# Patient Record
Sex: Male | Born: 1950 | Race: White | Hispanic: No | Smoking: Never smoker
Health system: Southern US, Community
[De-identification: ages and names within clinical notes are randomized; demographics above are authoritative.]

## PROBLEM LIST (undated history)

## (undated) DIAGNOSIS — K219 Gastro-esophageal reflux disease without esophagitis: Secondary | ICD-10-CM

## (undated) DIAGNOSIS — R519 Headache, unspecified: Secondary | ICD-10-CM

## (undated) DIAGNOSIS — M5430 Sciatica, unspecified side: Secondary | ICD-10-CM

## (undated) DIAGNOSIS — R51 Headache: Secondary | ICD-10-CM

## (undated) DIAGNOSIS — F329 Major depressive disorder, single episode, unspecified: Secondary | ICD-10-CM

## (undated) DIAGNOSIS — I10 Essential (primary) hypertension: Secondary | ICD-10-CM

## (undated) DIAGNOSIS — M549 Dorsalgia, unspecified: Secondary | ICD-10-CM

## (undated) DIAGNOSIS — I2510 Atherosclerotic heart disease of native coronary artery without angina pectoris: Secondary | ICD-10-CM

## (undated) DIAGNOSIS — IMO0001 Reserved for inherently not codable concepts without codable children: Secondary | ICD-10-CM

## (undated) DIAGNOSIS — F32A Depression, unspecified: Secondary | ICD-10-CM

## (undated) DIAGNOSIS — M1990 Unspecified osteoarthritis, unspecified site: Secondary | ICD-10-CM

## (undated) DIAGNOSIS — J449 Chronic obstructive pulmonary disease, unspecified: Secondary | ICD-10-CM

## (undated) HISTORY — PX: KNEE ARTHROSCOPY: SUR90

## (undated) HISTORY — PX: CYST EXCISION: SHX5701

## (undated) HISTORY — PX: TONSILLECTOMY: SUR1361

---

## 2011-01-02 ENCOUNTER — Observation Stay (HOSPITAL_COMMUNITY)
Admission: EM | Admit: 2011-01-02 | Discharge: 2011-01-02 | Payer: Self-pay | Source: Home / Self Care | Admitting: Emergency Medicine

## 2011-01-07 LAB — DIFFERENTIAL
Eosinophils Absolute: 0.1 10*3/uL (ref 0.0–0.7)
Eosinophils Relative: 3 % (ref 0–5)
Lymphs Abs: 1.1 10*3/uL (ref 0.7–4.0)

## 2011-01-07 LAB — GLUCOSE, CAPILLARY
Glucose-Capillary: 463 mg/dL — ABNORMAL HIGH (ref 70–99)
Glucose-Capillary: 450 mg/dL — ABNORMAL HIGH (ref 70–99)
Glucose-Capillary: 359 mg/dL — ABNORMAL HIGH (ref 70–99)

## 2011-01-07 LAB — CBC
MCV: 83.7 fL (ref 78.0–100.0)
Platelets: 207 10*3/uL (ref 150–400)
RDW: 12.8 % (ref 11.5–15.5)
WBC: 4.9 10*3/uL (ref 4.0–10.5)

## 2011-01-07 LAB — BASIC METABOLIC PANEL
BUN: 12 mg/dL (ref 6–23)
Calcium: 9.1 mg/dL (ref 8.4–10.5)
Creatinine, Ser: 0.85 mg/dL (ref 0.4–1.5)
GFR calc Af Amer: >60 mL/min (ref >60)
GFR calc non Af Amer: >60 mL/min (ref >60)
GFR calc non Af Amer: >60 mL/min (ref >60)
Potassium: 4.3 mEq/L (ref 3.5–5.1)
Potassium: 4.2 mEq/L (ref 3.5–5.1)
Sodium: 133 mEq/L — ABNORMAL LOW (ref 135–145)

## 2012-07-26 ENCOUNTER — Emergency Department (HOSPITAL_COMMUNITY)
Admission: EM | Admit: 2012-07-26 | Discharge: 2012-07-26 | Disposition: A | Discharge status: Home / Self Care | Payer: Self-pay | Attending: Emergency Medicine | Admitting: Emergency Medicine

## 2012-07-26 ENCOUNTER — Encounter (HOSPITAL_COMMUNITY): Payer: Self-pay | Admitting: *Deleted

## 2012-07-26 DIAGNOSIS — I10 Essential (primary) hypertension: Secondary | ICD-10-CM | POA: Insufficient documentation

## 2012-07-26 DIAGNOSIS — J449 Chronic obstructive pulmonary disease, unspecified: Secondary | ICD-10-CM | POA: Insufficient documentation

## 2012-07-26 DIAGNOSIS — Z880 Allergy status to penicillin: Secondary | ICD-10-CM | POA: Insufficient documentation

## 2012-07-26 DIAGNOSIS — J4489 Other specified chronic obstructive pulmonary disease: Secondary | ICD-10-CM | POA: Insufficient documentation

## 2012-07-26 DIAGNOSIS — L0291 Cutaneous abscess, unspecified: Secondary | ICD-10-CM

## 2012-07-26 DIAGNOSIS — E119 Type 2 diabetes mellitus without complications: Secondary | ICD-10-CM | POA: Insufficient documentation

## 2012-07-26 DIAGNOSIS — L0231 Cutaneous abscess of buttock: Secondary | ICD-10-CM | POA: Insufficient documentation

## 2012-07-26 HISTORY — DX: Chronic obstructive pulmonary disease, unspecified: J44.9

## 2012-07-26 HISTORY — DX: Essential (primary) hypertension: I10

## 2012-07-26 MED ORDER — CLINDAMYCIN HCL 150 MG PO CAPS
300.0000 mg | ORAL_CAPSULE | Freq: Once | ORAL | Status: AC
  Administered 2012-07-26: 300 mg via ORAL
  Filled 2012-07-26: qty 1

## 2012-07-26 MED ORDER — TRAMADOL HCL 50 MG PO TABS: 50.0000 mg | ORAL_TABLET | Freq: Four times a day (QID) | ORAL | Status: AC | PRN

## 2012-07-26 MED ORDER — CLINDAMYCIN HCL 150 MG PO CAPS: 300.0000 mg | ORAL_CAPSULE | Freq: Three times a day (TID) | ORAL | Status: AC

## 2012-07-26 NOTE — ED Notes (Signed)
Reports having abscess on buttock x 2 days that is causing pain, also has been out of meds for several weeks. No acute distress noted at triage.

## 2012-07-26 NOTE — ED Provider Notes (Signed)
History  Scribed for Gerhard Munch, MD, the patient was seen in room TR04C/TR04C. This chart was scribed by Candelaria Stagers. The patient's care started at 2:57 PM   CSN: [id number]  Arrival date & time 07/26/12  1242   First MD Initiated Contact with Patient 07/26/12 1347      Chief Complaint  Patient presents with  . Abscess     The history is provided by the patient.   Gavin Lawson is a 61 y.o. male who presents to the Emergency Department complaining of a painful abscess on his left buttock that started two days ago.  Pt reports that he has h/o several abscess over the last few years that have had to be removed.  He is also experiencing nausea and lower back pain.  He denies fever, chills, confusion, or headache.  Nothing seems to make the pain better or worse.  He is taking naproxen for the back pain with little relief.  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   Past Medical History  Diagnosis Date  . Diabetes mellitus   . COPD (chronic obstructive pulmonary disease)   . Hypertension     History reviewed. No pertinent past surgical history.  History reviewed. No pertinent family history.  History  Substance Use Topics  . Smoking status: Never Smoker   . Smokeless tobacco: Not on file  . Alcohol Use: Yes     occ      Review of Systems  Constitutional:       Per HPI, otherwise negative  HENT:       Per HPI, otherwise negative  Eyes: Negative.   Respiratory:       Per HPI, otherwise negative  Cardiovascular:       Per HPI, otherwise negative  Gastrointestinal: Negative for vomiting.  Genitourinary: Negative.   Musculoskeletal:       Per HPI, otherwise negative  Skin: Positive for wound (abscess on buttocks).  Neurological: Negative for syncope.    Allergies  Bee pollen; Other; and Penicillins  Home Medications   Current Outpatient Rx  Name Route Sig Dispense Refill  . NAPROXEN SODIUM  220 MG PO TABS Oral Take 660 mg by mouth 3 (three) times daily as needed. For pain.      BP 173/98  Pulse 84  Temp 98.2 F (36.8 C) (Oral)  Resp 18  SpO2 94%  Physical Exam  Nursing note and vitals reviewed. Constitutional: He is oriented to person, place, and time. He appears well-developed. No distress.  HENT:  Head: Normocephalic and atraumatic.  Eyes: Conjunctivae and EOM are normal.  Cardiovascular: Normal rate and regular rhythm.   Pulmonary/Chest: Effort normal. No stridor. No respiratory distress.  Abdominal: He exhibits no distension.  Musculoskeletal: He exhibits no edema.       12 cm indurated area on left buttocks.  Scabbing in the middle.  TTP   Neurological: He is alert and oriented to person, place, and time.  Skin: Skin is warm and dry.  Psychiatric: He has a normal mood and affect.    ED Course  INCISION AND DRAINAGE Date/Time: 07/26/2012 3:30 PM Performed by: Gerhard Munch Authorized by: Gerhard Munch Consent: Verbal consent obtained. Written consent not obtained. The procedure was performed in an emergent situation. Risks and benefits: risks, benefits and alternatives were discussed Consent given by: patient Patient understanding: patient states understanding of the procedure being performed Patient identity confirmed: verbally  with patient Time out: Immediately prior to procedure a "time out" was called to verify the correct patient, procedure, equipment, support staff and site/side marked as required. Type: abscess Body area: trunk Location details: back Anesthesia: local infiltration Local anesthetic: lidocaine 2% without epinephrine Anesthetic total: 5 ml Patient sedated: no Scalpel size: 11 Incision type: single straight Complexity: simple Drainage: serosanguinous Drainage amount: scant Wound treatment: wound left open Patient tolerance: Patient tolerated the procedure well with no immediate complications.     DIAGNOSTIC  STUDIES: Oxygen Saturation is 94% on room air, normal by my interpretation.    COORDINATION OF CARE:  3:15 PM incision and drainage of abscess on left buttock.   Area cleaned with betadine and alcohol wipes anesthetic used: 2% lidocaine without epi   Labs Reviewed - No data to display No results found.   No diagnosis found.    MDM  I personally performed the services described in this documentation, which was scribed in my presence. The recorded information has been reviewed and considered.  The patient presents with concerns of the left buttock lesion.  On exam he is in no distress, though there is a notable lesion on the left superior buttock without anorectal involvement.  The wound was incised, with production of a small amount of serosanguineous fluid.  Given the patient's recurrent lesions he was provided antibiotics, and we discussed the need for primary care management of his multiple medical conditions.  He was discharged with referral to one of the local clinics.  Absent fever, tachycardia, evidence of systemic infection, he was appropriate for discharge though is known to be hypertensive.   Gerhard Munch, MD 07/26/12 [phone]

## 2015-09-28 ENCOUNTER — Inpatient Hospital Stay (HOSPITAL_COMMUNITY)
Admission: EM | Admit: 2015-09-28 | Discharge: 2015-09-29 | DRG: 292 | Disposition: A | Discharge status: Home / Self Care | Payer: Self-pay | Attending: Internal Medicine | Admitting: Internal Medicine

## 2015-09-28 ENCOUNTER — Emergency Department (HOSPITAL_COMMUNITY): Payer: Self-pay

## 2015-09-28 ENCOUNTER — Encounter (HOSPITAL_COMMUNITY): Payer: Self-pay | Admitting: Emergency Medicine

## 2015-09-28 DIAGNOSIS — G4733 Obstructive sleep apnea (adult) (pediatric): Secondary | ICD-10-CM | POA: Diagnosis present

## 2015-09-28 DIAGNOSIS — Z6841 Body Mass Index (BMI) 40.0 and over, adult: Secondary | ICD-10-CM

## 2015-09-28 DIAGNOSIS — R0789 Other chest pain: Secondary | ICD-10-CM

## 2015-09-28 DIAGNOSIS — R609 Edema, unspecified: Secondary | ICD-10-CM

## 2015-09-28 DIAGNOSIS — Z86718 Personal history of other venous thrombosis and embolism: Secondary | ICD-10-CM

## 2015-09-28 DIAGNOSIS — K219 Gastro-esophageal reflux disease without esophagitis: Secondary | ICD-10-CM | POA: Diagnosis present

## 2015-09-28 DIAGNOSIS — E118 Type 2 diabetes mellitus with unspecified complications: Secondary | ICD-10-CM

## 2015-09-28 DIAGNOSIS — I5033 Acute on chronic diastolic (congestive) heart failure: Secondary | ICD-10-CM | POA: Diagnosis present

## 2015-09-28 DIAGNOSIS — M545 Low back pain, unspecified: Secondary | ICD-10-CM | POA: Diagnosis present

## 2015-09-28 DIAGNOSIS — G47419 Narcolepsy without cataplexy: Secondary | ICD-10-CM | POA: Diagnosis present

## 2015-09-28 DIAGNOSIS — G8929 Other chronic pain: Secondary | ICD-10-CM | POA: Diagnosis present

## 2015-09-28 DIAGNOSIS — I10 Essential (primary) hypertension: Secondary | ICD-10-CM

## 2015-09-28 DIAGNOSIS — I110 Hypertensive heart disease with heart failure: Principal | ICD-10-CM | POA: Diagnosis present

## 2015-09-28 DIAGNOSIS — E119 Type 2 diabetes mellitus without complications: Secondary | ICD-10-CM

## 2015-09-28 DIAGNOSIS — J449 Chronic obstructive pulmonary disease, unspecified: Secondary | ICD-10-CM | POA: Diagnosis present

## 2015-09-28 DIAGNOSIS — I152 Hypertension secondary to endocrine disorders: Secondary | ICD-10-CM | POA: Diagnosis present

## 2015-09-28 DIAGNOSIS — Z794 Long term (current) use of insulin: Secondary | ICD-10-CM

## 2015-09-28 DIAGNOSIS — E1165 Type 2 diabetes mellitus with hyperglycemia: Secondary | ICD-10-CM

## 2015-09-28 DIAGNOSIS — E1159 Type 2 diabetes mellitus with other circulatory complications: Secondary | ICD-10-CM | POA: Diagnosis present

## 2015-09-28 DIAGNOSIS — R1310 Dysphagia, unspecified: Secondary | ICD-10-CM

## 2015-09-28 DIAGNOSIS — J810 Acute pulmonary edema: Secondary | ICD-10-CM

## 2015-09-28 DIAGNOSIS — R0600 Dyspnea, unspecified: Secondary | ICD-10-CM | POA: Diagnosis present

## 2015-09-28 HISTORY — DX: Reserved for inherently not codable concepts without codable children: IMO0001

## 2015-09-28 HISTORY — DX: Headache: R51

## 2015-09-28 HISTORY — DX: Gastro-esophageal reflux disease without esophagitis: K21.9

## 2015-09-28 HISTORY — DX: Sciatica, unspecified side: M54.30

## 2015-09-28 HISTORY — DX: Unspecified osteoarthritis, unspecified site: M19.90

## 2015-09-28 HISTORY — DX: Headache, unspecified: R51.9

## 2015-09-28 HISTORY — DX: Dorsalgia, unspecified: M54.9

## 2015-09-28 HISTORY — DX: Depression, unspecified: F32.A

## 2015-09-28 HISTORY — DX: Major depressive disorder, single episode, unspecified: F32.9

## 2015-09-28 LAB — BASIC METABOLIC PANEL
Anion gap: 12 (ref 5–15)
BUN: 11 mg/dL (ref 6–20)
CALCIUM: 9.3 mg/dL (ref 8.9–10.3)
CO2: 26 mmol/L (ref 22–32)
Chloride: 101 mmol/L (ref 101–111)
Creatinine, Ser: 0.78 mg/dL (ref 0.61–1.24)
GFR calc Af Amer: >60 mL/min (ref >60)
GLUCOSE: 198 mg/dL — AB (ref 65–99)
POTASSIUM: 4.2 mmol/L (ref 3.5–5.1)
Sodium: 139 mmol/L (ref 135–145)

## 2015-09-28 LAB — GLUCOSE, CAPILLARY
GLUCOSE-CAPILLARY: 151 mg/dL — AB (ref 65–99)
Glucose-Capillary: 147 mg/dL — ABNORMAL HIGH (ref 65–99)

## 2015-09-28 LAB — CBC WITH DIFFERENTIAL/PLATELET
BASOS ABS: 0 10*3/uL (ref 0.0–0.1)
BASOS PCT: 1 %
EOS PCT: 4 %
Eosinophils Absolute: 0.2 10*3/uL (ref 0.0–0.7)
HEMATOCRIT: 40.7 % (ref 39.0–52.0)
Hemoglobin: 13.6 g/dL (ref 13.0–17.0)
LYMPHS PCT: 20 %
Lymphs Abs: 1.3 10*3/uL (ref 0.7–4.0)
MCH: 29.2 pg (ref 26.0–34.0)
MCHC: 33.4 g/dL (ref 30.0–36.0)
MCV: 87.3 fL (ref 78.0–100.0)
MONO ABS: 0.4 10*3/uL (ref 0.1–1.0)
Monocytes Relative: 7 %
NEUTROS ABS: 4.4 10*3/uL (ref 1.7–7.7)
Neutrophils Relative %: 70 %
PLATELETS: 209 10*3/uL (ref 150–400)
RBC: 4.66 MIL/uL (ref 4.22–5.81)
RDW: 13.3 % (ref 11.5–15.5)
WBC: 6.3 10*3/uL (ref 4.0–10.5)

## 2015-09-28 LAB — PROTIME-INR
INR: 1.11 (ref 0.00–1.49)
PROTHROMBIN TIME: 14.5 s (ref 11.6–15.2)

## 2015-09-28 LAB — TROPONIN I: Troponin I: <0.03 ng/mL (ref <0.031)

## 2015-09-28 LAB — PHOSPHORUS: PHOSPHORUS: 3.6 mg/dL (ref 2.5–4.6)

## 2015-09-28 LAB — LIPID PANEL
Cholesterol: 212 mg/dL — ABNORMAL HIGH (ref 0–200)
HDL: 52 mg/dL (ref >40)
LDL CALC: 141 mg/dL — AB (ref 0–99)
TRIGLYCERIDES: 95 mg/dL (ref <150)
Total CHOL/HDL Ratio: 4.1 RATIO
VLDL: 19 mg/dL (ref 0–40)

## 2015-09-28 LAB — APTT: aPTT: 27 seconds (ref 24–37)

## 2015-09-28 LAB — HEPATIC FUNCTION PANEL
ALK PHOS: 38 U/L (ref 38–126)
ALT: 17 U/L (ref 17–63)
AST: 17 U/L (ref 15–41)
Albumin: 3.6 g/dL (ref 3.5–5.0)
BILIRUBIN DIRECT: 0.1 mg/dL (ref 0.1–0.5)
BILIRUBIN TOTAL: 0.9 mg/dL (ref 0.3–1.2)
Indirect Bilirubin: 0.8 mg/dL (ref 0.3–0.9)
Total Protein: 6.3 g/dL — ABNORMAL LOW (ref 6.5–8.1)

## 2015-09-28 LAB — TSH: TSH: 2.565 u[IU]/mL (ref 0.350–4.500)

## 2015-09-28 LAB — CBG MONITORING, ED
GLUCOSE-CAPILLARY: 235 mg/dL — AB (ref 65–99)
Glucose-Capillary: 243 mg/dL — ABNORMAL HIGH (ref 65–99)

## 2015-09-28 LAB — RAPID URINE DRUG SCREEN, HOSP PERFORMED
AMPHETAMINES: NOT DETECTED
BARBITURATES: NOT DETECTED
BENZODIAZEPINES: NOT DETECTED
COCAINE: NOT DETECTED
OPIATES: NOT DETECTED
TETRAHYDROCANNABINOL: NOT DETECTED

## 2015-09-28 LAB — MAGNESIUM: Magnesium: 1.8 mg/dL (ref 1.7–2.4)

## 2015-09-28 LAB — BRAIN NATRIURETIC PEPTIDE: B Natriuretic Peptide: 111.1 pg/mL — ABNORMAL HIGH (ref 0.0–100.0)

## 2015-09-28 MED ORDER — SENNOSIDES-DOCUSATE SODIUM 8.6-50 MG PO TABS: 1.0000 | ORAL_TABLET | Freq: Every evening | ORAL | Status: DC | PRN

## 2015-09-28 MED ORDER — INSULIN ASPART 100 UNIT/ML ~~LOC~~ SOLN
0.0000 [IU] | Freq: Three times a day (TID) | SUBCUTANEOUS | Status: DC
  Administered 2015-09-28: 7 [IU] via SUBCUTANEOUS
  Administered 2015-09-29: 4 [IU] via SUBCUTANEOUS
  Administered 2015-09-29: 3 [IU] via SUBCUTANEOUS

## 2015-09-28 MED ORDER — ACETAMINOPHEN 325 MG PO TABS: 650.0000 mg | ORAL_TABLET | Freq: Four times a day (QID) | ORAL | Status: DC | PRN

## 2015-09-28 MED ORDER — OXYCODONE HCL 5 MG PO TABS: 7.5000 mg | ORAL_TABLET | Freq: Four times a day (QID) | ORAL | Status: DC | PRN

## 2015-09-28 MED ORDER — ALBUTEROL SULFATE (2.5 MG/3ML) 0.083% IN NEBU
5.0000 mg | INHALATION_SOLUTION | Freq: Once | RESPIRATORY_TRACT | Status: AC
  Administered 2015-09-28: 5 mg via RESPIRATORY_TRACT
  Filled 2015-09-28: qty 6

## 2015-09-28 MED ORDER — CHLORHEXIDINE GLUCONATE 0.12 % MT SOLN
15.0000 mL | Freq: Two times a day (BID) | OROMUCOSAL | Status: DC
  Administered 2015-09-28 – 2015-09-29 (×2): 15 mL via OROMUCOSAL
  Filled 2015-09-28 (×2): qty 15

## 2015-09-28 MED ORDER — ACETAMINOPHEN 650 MG RE SUPP: 650.0000 mg | Freq: Four times a day (QID) | RECTAL | Status: DC | PRN

## 2015-09-28 MED ORDER — INSULIN ASPART PROT & ASPART (70-30 MIX) 100 UNIT/ML ~~LOC~~ SUSP
15.0000 [IU] | Freq: Two times a day (BID) | SUBCUTANEOUS | Status: DC
  Administered 2015-09-28 – 2015-09-29 (×3): 15 [IU] via SUBCUTANEOUS
  Filled 2015-09-28 (×2): qty 10

## 2015-09-28 MED ORDER — FUROSEMIDE 10 MG/ML IJ SOLN
40.0000 mg | Freq: Once | INTRAMUSCULAR | Status: AC
  Administered 2015-09-28: 40 mg via INTRAVENOUS
  Filled 2015-09-28: qty 4

## 2015-09-28 MED ORDER — ONDANSETRON HCL 4 MG PO TABS: 4.0000 mg | ORAL_TABLET | Freq: Four times a day (QID) | ORAL | Status: DC | PRN

## 2015-09-28 MED ORDER — ALBUTEROL SULFATE (2.5 MG/3ML) 0.083% IN NEBU: 5.0000 mg | INHALATION_SOLUTION | RESPIRATORY_TRACT | Status: DC | PRN

## 2015-09-28 MED ORDER — ATORVASTATIN CALCIUM 40 MG PO TABS
40.0000 mg | ORAL_TABLET | Freq: Every day | ORAL | Status: DC
  Administered 2015-09-28: 40 mg via ORAL
  Filled 2015-09-28: qty 1

## 2015-09-28 MED ORDER — CETYLPYRIDINIUM CHLORIDE 0.05 % MT LIQD
7.0000 mL | Freq: Two times a day (BID) | OROMUCOSAL | Status: DC
  Administered 2015-09-29 (×2): 7 mL via OROMUCOSAL

## 2015-09-28 MED ORDER — FUROSEMIDE 10 MG/ML IJ SOLN
80.0000 mg | Freq: Two times a day (BID) | INTRAMUSCULAR | Status: DC
  Administered 2015-09-28 – 2015-09-29 (×2): 80 mg via INTRAVENOUS
  Filled 2015-09-28 (×2): qty 8

## 2015-09-28 MED ORDER — PANTOPRAZOLE SODIUM 40 MG PO TBEC
40.0000 mg | DELAYED_RELEASE_TABLET | Freq: Every day | ORAL | Status: DC
  Administered 2015-09-28 – 2015-09-29 (×2): 40 mg via ORAL
  Filled 2015-09-28 (×2): qty 1

## 2015-09-28 MED ORDER — OXYCODONE HCL 5 MG PO TABS
7.5000 mg | ORAL_TABLET | Freq: Four times a day (QID) | ORAL | Status: DC | PRN
  Administered 2015-09-28 – 2015-09-29 (×2): 7.5 mg via ORAL
  Filled 2015-09-28 (×2): qty 2

## 2015-09-28 MED ORDER — LISINOPRIL 20 MG PO TABS
20.0000 mg | ORAL_TABLET | Freq: Every day | ORAL | Status: DC
  Administered 2015-09-28 – 2015-09-29 (×2): 20 mg via ORAL
  Filled 2015-09-28 (×2): qty 1

## 2015-09-28 MED ORDER — ENOXAPARIN SODIUM 40 MG/0.4ML ~~LOC~~ SOLN
40.0000 mg | SUBCUTANEOUS | Status: DC
  Administered 2015-09-28: 40 mg via SUBCUTANEOUS
  Filled 2015-09-28: qty 0.4

## 2015-09-28 MED ORDER — ONDANSETRON HCL 4 MG/2ML IJ SOLN: 4.0000 mg | Freq: Four times a day (QID) | INTRAMUSCULAR | Status: DC | PRN

## 2015-09-28 MED ORDER — SODIUM CHLORIDE 0.9 % IJ SOLN
3.0000 mL | Freq: Two times a day (BID) | INTRAMUSCULAR | Status: DC
  Administered 2015-09-28 – 2015-09-29 (×3): 3 mL via INTRAVENOUS

## 2015-09-28 NOTE — Progress Notes (Signed)
Called [phone] and got report on pt.  Will await for pt. To arrive to the floor.  Forbes Cellarelcine Arryn Terrones, RN

## 2015-09-28 NOTE — Progress Notes (Signed)
Gavin AltoRonald Lawson [id number] Admitted to [id number]: 09/28/2015 8:02 PM Attending Provider: Cliffton AstersJohn Campbell, MD    Gavin AltoRonald Lawson is a 64 y.o. male patient admitted from ED awake, alert  & orientated  X 3,  Full Code, VSS - Blood pressure 152/65, pulse 76, temperature 98.4 F (36.9 Lawson), temperature source Oral, resp. rate 18, height 5\' 8"  (1.727 m), weight 132.45 kg (292 lb), SpO2 97 %., O2    2 L nasal cannular, no Lawson/o shortness of breath, no Lawson/o chest pain, no distress noted. Tele # [id number] placed and pt is currently running:normal sinus rhythm.   IV site WDL:  antecubital left, condition patent and no redness with a transparent dsg that's clean dry and intact.  Allergies:   Allergies  Allergen Reactions  . Bee Venom Shortness Of Breath and Rash  . Codeine Nausea And Vomiting  . Other Nausea And Vomiting    Tylenol #3  . Penicillins Other (See Comments)    Has patient had a PCN reaction causing immediate rash, facial/tongue/throat swelling, SOB or lightheadedness with hypotension:/[id number]} Has patient had a PCN reaction causing severe rash involving mucus membranes or skin necrosis: /[id number]}/No:[id number]} Has patient had a PCN reaction that required hospitalization [id number]}o:[id number]} Has patient had a PCN reaction occurring within the last 10 years: /[id number]}/No:[id number]} If all of the above answers are "NO", then may proceed with Cephalosporin use.     Past Medical History  Diagnosis Date  . COPD (chronic obstructive pulmonary disease) (HCC)   . Hypertension   . Shortness of breath dyspnea   . Diabetes mellitus     INSULIN DEPENDENT  . Depression   . GERD (gastroesophageal reflux disease)   . Headache   . Arthritis   . Sciatic nerve pain   . Back pain     History: will be obtained from the patient by admission RN.  Pt orientation to unit, room and routine. Information packet given to patient/family and safety video watched.  Admission INP armband ID verified with patient/family, and in place. SR up x 2,  fall risk assessment complete with Patient and family verbalizing understanding of risks associated with falls. Pt verbalizes an understanding of how to use the call bell and to call for help before getting out of bed.  Skin, clean-dry- intact without evidence of bruising, or skin tears.   No evidence of skin break down noted on exam.    Will cont to monitor and assist as needed.  Gavin Lawson, Gavin Dubs C, RN 09/28/2015 8:02 PM

## 2015-09-28 NOTE — ED Provider Notes (Signed)
CSN: [id number]     Arrival date & time 09/28/15  [id number] History   First MD Initiated Contact with Patient 09/28/15 [phone]     Chief Complaint  Patient presents with  . Shortness of Breath     (Consider location/radiation/quality/duration/timing/severity/associated sxs/prior Treatment) HPI.... Worsening dyspnea for the past several days with associated minimal chest pain.  Symptoms are worse with exertion. Patient was seen yesterday by a physician and prescribed Zantac for indigestion. Review systems positive for peripheral edema. Past medical history includes obesity, hypertension, diabetes, COPD. Severity is moderate.  Past Medical History  Diagnosis Date  . Diabetes mellitus   . COPD (chronic obstructive pulmonary disease) (HCC)   . Hypertension    History reviewed. No pertinent past surgical history. History reviewed. No pertinent family history. Social History  Substance Use Topics  . Smoking status: Never Smoker   . Smokeless tobacco: None  . Alcohol Use: Yes     Comment: occ    Review of Systems  All other systems reviewed and are negative.     Allergies  Bee venom; Codeine; Other; and Penicillins  Home Medications   Prior to Admission medications   Medication Sig Start Date End Date Taking? Authorizing Provider  insulin NPH-regular Human (NOVOLIN 70/30) (70-30) 100 UNIT/ML injection Inject 20 Units into the skin 2 (two) times daily with a meal.  06/28/15  Yes Historical Provider, MD  lisinopril (PRINIVIL,ZESTRIL) 20 MG tablet Take 20 mg by mouth. 06/28/15 06/27/16 Yes Historical Provider, MD  Naproxen Sodium 220 MG CAPS Take 880 mg by mouth 3 (three) times daily as needed (pain).   Yes Historical Provider, MD  oxyCODONE (ROXICODONE) 15 MG immediate release tablet Take 7.5 mg by mouth every 6 (six) hours as needed for pain.  05/30/15  Yes Historical Provider, MD  pantoprazole (PROTONIX) 40 MG tablet Take 40 mg by mouth. 09/27/15  Yes Historical Provider, MD  ranitidine  (ZANTAC) 150 MG tablet Take 150 mg by mouth 2 (two) times daily.  05/30/15  Yes Historical Provider, MD   BP 131/67 mmHg  Pulse 86  Temp(Src) 97.8 F (36.6 C) (Oral)  Resp 18  Ht 5\' 8"  (1.727 m)  Wt 292 lb (132.45 kg)  BMI 44.41 kg/m2  SpO2 97% Physical Exam  Constitutional: He is oriented to person, place, and time.  Obese, no resp distress  HENT:  Head: Normocephalic and atraumatic.  Eyes: Conjunctivae and EOM are normal. Pupils are equal, round, and reactive to light.  Neck: Normal range of motion. Neck supple.  Cardiovascular: Normal rate and regular rhythm.   Pulmonary/Chest: Effort normal.  Minimal rales  Abdominal: Soft. Bowel sounds are normal.  Musculoskeletal: Normal range of motion.  Neurological: He is alert and oriented to person, place, and time.  Skin:  2+ peripheral edema  Psychiatric: He has a normal mood and affect. His behavior is normal.  Nursing note and vitals reviewed.   ED Course  Procedures (including critical care time) Labs Review Labs Reviewed  BASIC METABOLIC PANEL - Abnormal; Notable for the following:    Glucose, Bld 198 (*)    All other components within normal limits  CBC WITH DIFFERENTIAL/PLATELET  TROPONIN I  HEPATIC FUNCTION PANEL  MAGNESIUM  PHOSPHORUS  BRAIN NATRIURETIC PEPTIDE  TSH  PROTIME-INR  APTT  HEMOGLOBIN A1C  LIPID PANEL  HIV ANTIBODY (ROUTINE TESTING)  HEPATITIS C ANTIBODY (REFLEX)    Imaging Review Dg Chest 2 View  09/28/2015  CLINICAL DATA:  Shortness of breath.  COPD. EXAM:  CHEST  2 VIEW COMPARISON:  01/03/2011 FINDINGS: There is diffuse interstitial accentuation with Kerley B-lines at the bases consistent with interstitial edema. Overall heart size and pulmonary vascularity appear normal. No effusions. No acute osseous abnormality. Slight thoracolumbar scoliosis. IMPRESSION: Interstitial pulmonary edema. Electronically Signed   By: Francene Boyers M.D.   On: 09/28/2015 07:32   I have personally reviewed and  evaluated these images and lab results as part of my medical decision-making.   EKG Interpretation   Date/Time:  Thursday September 28 2015 07:03:34 EDT Ventricular Rate:  82 PR Interval:  218 QRS Duration: 103 QT Interval:  387 QTC Calculation: 452 R Axis:   111 Text Interpretation:  Sinus rhythm Borderline prolonged PR interval  Inferior infarct, old Anterior infarct, old Confirmed by Analyse Angst  MD, Cullin Dishman  ([id number]) on 09/28/2015 7:44:43 AM      MDM   Final diagnoses:  Acute pulmonary edema (HCC)    Patient is hemodynamically stable. Chest x-ray shows pulmonary edema. EKG and troponin negative acute. IV Lasix. Admit to general medicine.    Donnetta Hutching, MD 09/28/15 1102

## 2015-09-28 NOTE — ED Notes (Signed)
Attempted report,  

## 2015-09-28 NOTE — ED Notes (Signed)
Spoke with Pharmacy patient states he takes 70/30 twice a day and needs morning does. Pharmacy states will sent shortly Meal tray ordered, .

## 2015-09-28 NOTE — ED Notes (Signed)
Patient c/o shortness of breath starting yesterday. Patient saw PCP and was given Zantac for epigastric pain.  Patient woke up this morning and felt increased SOB and drove to the ED.  Patient tearful upon arrival to ED, stating he feels helpless and just can't breathe.  Patient also c/o upper chest tightness (near neck) and lightheadedness.  Denies N/V, dizziness. Patient A&O x 4.
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
## 2015-09-29 ENCOUNTER — Inpatient Hospital Stay (HOSPITAL_COMMUNITY): Payer: Self-pay

## 2015-09-29 DIAGNOSIS — R609 Edema, unspecified: Secondary | ICD-10-CM

## 2015-09-29 DIAGNOSIS — R0600 Dyspnea, unspecified: Secondary | ICD-10-CM

## 2015-09-29 LAB — BASIC METABOLIC PANEL
ANION GAP: 8 (ref 5–15)
BUN: 15 mg/dL (ref 6–20)
CALCIUM: 8.4 mg/dL — AB (ref 8.9–10.3)
CHLORIDE: 98 mmol/L — AB (ref 101–111)
CO2: 30 mmol/L (ref 22–32)
Creatinine, Ser: 0.89 mg/dL (ref 0.61–1.24)
GFR calc non Af Amer: >60 mL/min (ref >60)
Glucose, Bld: 161 mg/dL — ABNORMAL HIGH (ref 65–99)
POTASSIUM: 3.7 mmol/L (ref 3.5–5.1)
Sodium: 136 mmol/L (ref 135–145)

## 2015-09-29 LAB — MAGNESIUM: Magnesium: 1.7 mg/dL (ref 1.7–2.4)

## 2015-09-29 LAB — HCV COMMENT:

## 2015-09-29 LAB — HEMOGLOBIN A1C
HEMOGLOBIN A1C: 9.5 % — AB (ref 4.8–5.6)
Mean Plasma Glucose: 226 mg/dL

## 2015-09-29 LAB — GLUCOSE, CAPILLARY
GLUCOSE-CAPILLARY: 184 mg/dL — AB (ref 65–99)
GLUCOSE-CAPILLARY: 143 mg/dL — AB (ref 65–99)

## 2015-09-29 LAB — HIV ANTIBODY (ROUTINE TESTING W REFLEX): HIV SCREEN 4TH GENERATION: NONREACTIVE

## 2015-09-29 LAB — TROPONIN I

## 2015-09-29 LAB — HEPATITIS C ANTIBODY (REFLEX)

## 2015-09-29 MED ORDER — OMEPRAZOLE 20 MG PO CPDR: 20.0000 mg | DELAYED_RELEASE_CAPSULE | Freq: Every day | ORAL | Status: DC

## 2015-09-29 MED ORDER — ENOXAPARIN SODIUM 60 MG/0.6ML ~~LOC~~ SOLN: 60.0000 mg | SUBCUTANEOUS | Status: DC

## 2015-09-29 MED ORDER — FUROSEMIDE 40 MG PO TABS: 40.0000 mg | ORAL_TABLET | Freq: Every day | ORAL | Status: DC

## 2015-09-29 MED ORDER — ENOXAPARIN SODIUM 80 MG/0.8ML ~~LOC~~ SOLN
70.0000 mg | SUBCUTANEOUS | Status: DC
  Administered 2015-09-29: 70 mg via SUBCUTANEOUS
  Filled 2015-09-29: qty 0.8

## 2015-09-29 NOTE — Progress Notes (Signed)
Pharmacist Provided - Patient Medication Education Prior to Discharge   Quintin AltoRonald Peppard is an 64 y.o. male who [facility] with a chief complaint of  Chief Complaint  Patient presents with  . Shortness of Breath     [x]  Patient will be discharged with 2 new medications  The following medications were discussed with the patient: furosemide  Pain Control medications: []  Yes    [x]  No  Diabetes Medications: []  Yes    [x]  No  Heart Failure Medications: [x]  Yes    []  No  Anticoagulation Medications:  []  Yes    [x]  No  Antibiotics at discharge: []  Yes    [x]  No  Allergy Assessment Completed and Updated: [x]  Yes    []  No Identified Patient Allergies:  Allergies  Allergen Reactions  . Bee Venom Shortness Of Breath and Rash  . Codeine Nausea And Vomiting  . Other Nausea And Vomiting    Tylenol #3  . Penicillins Other (See Comments)    Has patient had a PCN reaction causing immediate rash, facial/tongue/throat swelling, SOB or lightheadedness with hypotension:/[id number]} Has patient had a PCN reaction causing severe rash involving mucus membranes or skin necrosis: /[id number]}/No:[id number]} Has patient had a PCN reaction that required hospitalization [id number]} Has patient had a PCN reaction occurring within the last 10 years: /[id number]}/No:[id number]} If all of the above answers are "NO", then may proceed with Cephalosporin use.     Medication Adherence Assessment: []  Excellent (no doses missed/week)      [x]  Good (1 dose missed/week)      []  Partial (2-3 doses missed/week)      []  Poor (>3 doses missed/week)  Barriers to Obtaining Medications: []  Yes [x]  No    Time spent preparing for discharge counseling: 10 min Time spent counseling patient: 10 min  Annamary Carolinristina E Reyes, PharmD 09/29/2015, 5:46 PM
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
## 2015-09-29 NOTE — Progress Notes (Signed)
Patient was discharged home by MD order; discharged instructions review and give to patient with care notes and prescriptions; IV DIC; skin intact; patient will be escorted to the car by nurse tech via wheelchair.  

## 2015-09-29 NOTE — Progress Notes (Signed)
Subjective: NAEON.  Patient mostly complaining of low back pain that is chronic.  He notes improved breathing after the Lasix 80 IV in the ED yesterday.  He denies currently being SOB or chest pain.    He reports coming to the hospital because he woke up unable to breathe, gasping for breath.  This has only occurred once before, a couple days ago.  He also becomes short of breath with walking short distances.  He will chest pain on exertion, described as burning pains that last 15s and radiate to shoulder.  Chest pain is reproduced with coughing, as well as his indigestion.  He denies having had an MI, but has had a 30 lb weight gain over the last year.  He has a h/o OSA and narcolepsy diagnosed sometime in the 90s, but states he did not tolerate CPAP.  Objective: Vital signs in last 24 hours: Filed Vitals:   09/28/15 2114 09/29/15 0523 09/29/15 1346 09/29/15 1415  BP: 130/60 137/64 137/59   Pulse: 72 83 72   Temp: 98 F (36.7 C) 98.4 F (36.9 C) 98.5 F (36.9 C)   TempSrc: Oral Oral Oral   Resp: Height:      Weight:  293 lb 3.4 oz (133 kg)    SpO2: 99% 96% 96% 95%   Weight change: 0 lb (0 kg)  Intake/Output Summary (Last 24 hours) at 09/29/15 1529 Last data filed at 09/29/15 1119  Gross per 24 hour  Intake    210 ml  Output   3525 ml  Net  -3315 ml   Physical Exam  Constitutional: He is oriented to person, place, and time.  Pleasant, obese male, lying in bed, NAD.  HENT:  Head: Normocephalic and atraumatic.  Eyes: EOM are normal. No scleral icterus.  Neck: No tracheal deviation present.  JVD not appreciated 2/2 body habitus.  Cardiovascular: Normal rate, regular rhythm, normal heart sounds and intact distal pulses.   Pulmonary/Chest: Effort normal and breath sounds normal. No respiratory distress. He has no wheezes.  No crackles appreciated.  Abdominal: Soft. He exhibits no distension. There is no tenderness. There is no rebound and no guarding.    Musculoskeletal:  2+ peripheral edema bilaterally.  Neurological: He is alert and oriented to person, place, and time.  Skin: Skin is warm and dry.  Chronic venous stasis changes on bilateral shins.    Lab Results: Basic Metabolic Panel:  Recent Labs Lab 09/28/15 0806 09/28/15 1048 09/29/15 0515  NA 139  --  136  K 4.2  --  3.7  CL 101  --  98*  CO2 26  --  30  GLUCOSE 198*  --  161*  BUN 11  --  15  CREATININE 0.78  --  0.89  CALCIUM 9.3  --  8.4*  MG  --  1.8 1.7  PHOS  --  3.6  --    Liver Function Tests:  Recent Labs Lab 09/28/15 1048  AST 17  ALT 17  ALKPHOS 38  BILITOT 0.9  PROT 6.3*  ALBUMIN 3.6   No results for input(s): LIPASE, AMYLASE in the last 168 hours. No results for input(s): AMMONIA in the last 168 hours. CBC:  Recent Labs Lab 09/28/15 0806  WBC 6.3  NEUTROABS 4.4  HGB 13.6  HCT 40.7  MCV 87.3  PLT 209   Cardiac Enzymes:  Recent Labs Lab 09/28/15 0806 09/29/15 0515  TROPONINI <0.03 <0.03   BNP: No results  for input(s): PROBNP in the last 168 hours. D-Dimer: No results for input(s): DDIMER in the last 168 hours. CBG:  Recent Labs Lab 09/28/15 1101 09/28/15 1300 09/28/15 1701 09/28/15 2048 09/29/15 0840 09/29/15 1203  GLUCAP 235* 243* 151* 147* 184* 143*   Hemoglobin A1C:  Recent Labs Lab 09/28/15 1134  HGBA1C 9.5*   Fasting Lipid Panel:  Recent Labs Lab 09/28/15 1135  CHOL 212*  HDL 52  LDLCALC 141*  TRIG 95  CHOLHDL 4.1   Thyroid Function Tests:  Recent Labs Lab 09/28/15 1135  TSH 2.565   Coagulation:  Recent Labs Lab 09/28/15 1135  LABPROT 14.5  INR 1.11   Anemia Panel: No results for input(s): VITAMINB12, FOLATE, FERRITIN, TIBC, IRON, RETICCTPCT in the last 168 hours. Urine Drug Screen: Drugs of Abuse     Component Value Date/Time   LABOPIA NONE DETECTED 09/28/2015 1915   COCAINSCRNUR NONE DETECTED 09/28/2015 1915   LABBENZ NONE DETECTED 09/28/2015 1915   AMPHETMU NONE DETECTED  09/28/2015 1915   THCU NONE DETECTED 09/28/2015 1915   LABBARB NONE DETECTED 09/28/2015 1915    Alcohol Level: No results for input(s): ETH in the last 168 hours. Urinalysis: No results for input(s): COLORURINE, LABSPEC, PHURINE, GLUCOSEU, HGBUR, BILIRUBINUR, KETONESUR, PROTEINUR, UROBILINOGEN, NITRITE, LEUKOCYTESUR in the last 168 hours.  Invalid input(s): APPERANCEUR Misc. Labs:   Micro Results: No results found for this or any previous visit (from the past 240 hour(s)). Studies/Results: Dg Chest 2 View  09/29/2015  CLINICAL DATA:  Dyspnea. EXAM: CHEST  2 VIEW COMPARISON:  September 28, 2015. FINDINGS: The heart size and mediastinal contours are within normal limits. No pneumothorax or pleural effusion is noted. Pulmonary edema noted on prior exam is nearly resolved. Minimal bibasilar edema or subsegmental atelectasis is noted. The visualized skeletal structures are unremarkable. IMPRESSION: Minimal bibasilar edema or atelectasis is noted which is significantly improved compared to prior exam. Electronically Signed   By: Lupita Raider, M.D.   On: 09/29/2015 08:27   Dg Chest 2 View  09/28/2015  CLINICAL DATA:  Shortness of breath.  COPD. EXAM: CHEST  2 VIEW COMPARISON:  01/03/2011 FINDINGS: There is diffuse interstitial accentuation with Kerley B-lines at the bases consistent with interstitial edema. Overall heart size and pulmonary vascularity appear normal. No effusions. No acute osseous abnormality. Slight thoracolumbar scoliosis. IMPRESSION: Interstitial pulmonary edema. Electronically Signed   By: Francene Boyers M.D.   On: 09/28/2015 07:32   Medications: I have reviewed the patient's current medications. Scheduled Meds: . antiseptic oral rinse  7 mL Mouth Rinse q12n4p  . atorvastatin  40 mg Oral q1800  . chlorhexidine  15 mL Mouth Rinse BID  . enoxaparin (LOVENOX) injection  70 mg Subcutaneous Q24H  . furosemide  80 mg Intravenous BID  . insulin aspart  0-20 Units Subcutaneous  TID WC  . insulin aspart protamine- aspart  15 Units Subcutaneous BID WC  . lisinopril  20 mg Oral Daily  . pantoprazole  40 mg Oral Daily  . sodium chloride  3 mL Intravenous Q12H   Continuous Infusions:  PRN Meds:.acetaminophen **OR** acetaminophen, albuterol, ondansetron **OR** ondansetron (ZOFRAN) IV, oxyCODONE, senna-docusate Assessment/Plan: Principal Problem:   Dyspnea Active Problems:   Hypertension   OSA (obstructive sleep apnea)   Type 2 diabetes mellitus (HCC)   GERD (gastroesophageal reflux disease)   Dysphagia   History of DVT (deep vein thrombosis)   Chronic low back pain  Dyspnea most likely due to Acute on Chronic dCHF - Pt with chronic  DOE and 2 episodes of PND with b/l LE pitting edema and chest xray findings of interstitial pulmonary edema. Symptoms improved with IV lasix administration. NYHA Class 3 symptoms. He has no prior diagnosis of CHF. He does have history of provoked DVT, but Well-score low risk (1.5) for PE. He describes atypical chest pain, but troponins negative, and no ischemic EKG changes to suggest ACS (although he has unchanged q-waves in anterior leads). He has no formal diagnosis of COPD and denies past/present tobacco use.  Outside records from PCP has ddx of COPD vs external compression of airway (likely 2/2 GERD). They were going to follow up with evaluation by PFTs, swallow eval, and esophagram. He also has longstanding, untreated OSA which is likely contributing to the current acute dCHF exacerbation. - Ambulated with SpO2 of 97% on RA. - Echo with grade 1 diastolic dysfunction - BNP 111 - Repeat CXR shows improved interstitial edema - TSH level and UDS normal - Lasix 40 mg PO daily  - LE Doppler read pending - PT/OT - F/U for already scheduled PFTs and swallow eval/esophagram  Dysphagia in setting of GERD - Pt with acid reflux symptoms with alarm symptoms of dysphagia to solids and liquids that is chronic. No specific recommendation by speech  other than age appropriate regular solids. - Speech eval recommends age appropriate regular solids -Start protonix 40 mg daily  -Pt reports has outpatient follow-up for further work-up for   Hypertension - Pt currently normotensive however was hypertensive to 192/95 on admission.  -Restart home lisinopril 20 mg daily  -Start IV lasix 80 mg BID -Avoid BB in setting of possible acute CHF  DMII- BG 198 on admission with no recent A1c. Pt at home on Novolin 70/30 20 U BID with meals that was recently started in June of this year. Unclear why no longer on metformin at home.  -Obtain A1c and lipid panel  -Start lipitor 40 mg daily  -Start resistant SSI with meals and Novolog mix 15 U BID -Monitor CBG's before meals and at bedtime   Chronic low back pain -Currently stable.  -Start tylenol 650 mg Q 6 hr PRN pain -Restart home oxycodone IR 7.5 mg Q hr PRN pain  OSA in setting of morbid obesity- Pt is not on CPAP at home. Pt with morbid obesity with BMi 44.5  -CPAP as tolerated at night -Encourage weight loss  HIV and HCV Screening - Pt with no prior screening.  -Obtain HIV and HCV Ab   Diet: 2 g salt restricted with 2L fluid restriction DVT PPx: Lovenox Code: Full  Dispo: Disposition is deferred at this time, awaiting improvement of current medical problems.  Anticipated discharge in approximately 1 day(s).   The patient does have a current PCP (Provider Default, MD) and [facility] discharge.  The patient does not have transportation limitations that hinder transportation to clinic appointments.  .Services Needed at time of discharge: Y = Yes, Blank = No PT:   OT:   RN:   Equipment:   Other:  3n1 commode, shower bench    LOS: 1 day   Jana HalfNicholas A Vannah Nadal, MD 09/29/2015, 3:29 PM

## 2015-09-29 NOTE — Progress Notes (Signed)
Preliminary results by tech - Bilateral Lower Ext. Venous Duplex Completed. No evidence of deep or superficial vein thrombosis in both lower extremities. Gavin Lawson, BS, RDMS, RVT

## 2015-09-29 NOTE — H&P (Signed)
   Date: 09/29/2015  Patient name: Gavin Lawson  Medical record number: [id number]  Date of birth: [birth year]   I have seen and evaluated Gavin Lawson and discussed their care with the Residency Team.   Assessment and Plan: I have seen and evaluated the patient as outlined above. I agree with the formulated Assessment and Plan as detailed in the residents' admission note. Mr. Gavin Lawson has long-standing hypertension, diabetes, chronic low back pain, and obesity. He has chronic dyspnea on exertion and has not been able to climb a flight of stairs for many years. He recently has had about a 30 pound weight gain (he associates this with starting insulin last June). Over the last several days he has become more acutely short of breath. The last 2 mornings he has woken suddenly from sleep with severe dyspnea suggesting paroxysmal nocturnal dyspnea. On presentation here yesterday he had bibasilar crackles and interstitial infiltrates on chest x-ray. He had a prompt clinical and radiographic improvement after receiving Lasix. His EKG shows attenuated voltage related to his obesity some mild conduction delay, and Q waves in his inferior and anterior leads. This does not appear to be a change from her recent EKGs [facility].  His echocardiogram revealed: - Left ventricle: The cavity size was normal. There was moderate concentric hypertrophy. Systolic function was vigorous. The estimated ejection fraction was in the range of 65% to 70%. Wall motion was normal; there were no regional wall motion abnormalities. Doppler parameters are consistent with abnormal left ventricular relaxation (grade 1 diastolic dysfunction). There intermediate ventricular filling pressure by Doppler parameters. - Aortic valve: There was no regurgitation. - Aortic root: The aortic root was normal in size. - Mitral valve: Calcified annulus. Mildly thickened leaflets  . Transvalvular velocity was within the normal range. There was no evidence for stenosis. There was no regurgitation. - Left atrium: The atrium was normal in size. - Right ventricle: The cavity size was mildly dilated. Wall thickness was normal. - Right atrium: The atrium was normal in size. - Tricuspid valve: There was trivial regurgitation. - Pulmonic valve: There was no regurgitation. - Pulmonary arteries: Systolic pressure was within the normal range. - Inferior vena cava: The vessel was normal in size. - Pericardium, extracardiac: There was no pericardial effusion.  He has a history of obstructive sleep apnea diagnosed over 20 years ago but is on no treatment I suspect that he may have some diastolic heart failure related to OSA and possibly underlying lung disease. He already has an appointment set up to see a pulmonologist next month. We will discharge him home with oral Lasix and followup with his primary care physician.  Gavin AstersJohn Shahla Betsill, MD 10/14/20161:10 PM

## 2015-09-29 NOTE — Progress Notes (Signed)
Pt refuses CPAP at this time. Pt states he has not used CPAP in years. Encouraged pt to let RT know if he changes his mind.
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
## 2015-09-29 NOTE — Discharge Instructions (Signed)
1. Take Lasix 40 mg by mouth daily until you follow up with your PCP.  This is an important medication to help your breathing.  It is very important that you purchase this prescription. 2.  3. [facility] 10/05/15 at 9:45.
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
## 2015-09-29 NOTE — Progress Notes (Signed)
[facility]. CM explained to patient that they may use the on site pharmacy to fill prescriptions given to them at discharge. Patient aware that [facility] will not fill narcotics or pain medications prior to the patient being seen by one of their physicians.  Patient aware that they must be seen as a patient prior to the pharmacy filling the prescriptions a second time. PAtient received map and contact info for [facility]

## 2015-09-29 NOTE — Progress Notes (Signed)
Utilization review completed. Denarius Sesler, RN, BSN. 

## 2015-09-29 NOTE — Progress Notes (Signed)
*  PRELIMINARY RESULTS* Echocardiogram 2D Echocardiogram has been performed.  Jeryl Columbialliott, Jamesa Tedrick 09/29/2015, 10:37 AM

## 2015-10-09 ENCOUNTER — Ambulatory Visit: Payer: Self-pay | Admitting: Family Medicine

## 2016-01-02 DIAGNOSIS — I5032 Chronic diastolic (congestive) heart failure: Secondary | ICD-10-CM | POA: Diagnosis present

## 2016-01-02 DIAGNOSIS — I2510 Atherosclerotic heart disease of native coronary artery without angina pectoris: Secondary | ICD-10-CM | POA: Diagnosis present

## 2016-01-02 HISTORY — DX: Atherosclerotic heart disease of native coronary artery without angina pectoris: I25.10

## 2016-01-02 HISTORY — PX: CORONARY ARTERY BYPASS GRAFT: SHX141

## 2016-02-28 ENCOUNTER — Telehealth (HOSPITAL_COMMUNITY): Payer: Self-pay

## 2016-02-28 NOTE — Telephone Encounter (Signed)
Deductible of $500 have been met, co-insurance is 50%, out of pocket is $800 and have been met. Cardiac rehab will be covered 100% Ref.# Gavin Lawson [id number]

## 2016-04-02 ENCOUNTER — Encounter (HOSPITAL_COMMUNITY): Payer: Self-pay

## 2016-04-04 ENCOUNTER — Encounter (HOSPITAL_COMMUNITY): Payer: Self-pay

## 2016-04-04 ENCOUNTER — Encounter (HOSPITAL_COMMUNITY)
Admission: RE | Admit: 2016-04-04 | Discharge: 2016-04-04 | Disposition: A | Discharge status: Home / Self Care | Payer: BLUE CROSS/BLUE SHIELD | Source: Ambulatory Visit | Attending: Cardiology | Admitting: Cardiology

## 2016-04-04 VITALS — BP 142/72 | HR 62 | Ht 68.5 in | Wt 300.7 lb

## 2016-04-04 DIAGNOSIS — I10 Essential (primary) hypertension: Secondary | ICD-10-CM | POA: Insufficient documentation

## 2016-04-04 DIAGNOSIS — I2510 Atherosclerotic heart disease of native coronary artery without angina pectoris: Secondary | ICD-10-CM | POA: Diagnosis not present

## 2016-04-04 DIAGNOSIS — Z951 Presence of aortocoronary bypass graft: Secondary | ICD-10-CM | POA: Insufficient documentation

## 2016-04-04 DIAGNOSIS — J449 Chronic obstructive pulmonary disease, unspecified: Secondary | ICD-10-CM | POA: Diagnosis not present

## 2016-04-04 DIAGNOSIS — K219 Gastro-esophageal reflux disease without esophagitis: Secondary | ICD-10-CM | POA: Insufficient documentation

## 2016-04-04 DIAGNOSIS — E119 Type 2 diabetes mellitus without complications: Secondary | ICD-10-CM | POA: Diagnosis not present

## 2016-04-04 DIAGNOSIS — Z79899 Other long term (current) drug therapy: Secondary | ICD-10-CM | POA: Insufficient documentation

## 2016-04-04 DIAGNOSIS — Z7982 Long term (current) use of aspirin: Secondary | ICD-10-CM | POA: Insufficient documentation

## 2016-04-04 DIAGNOSIS — Z794 Long term (current) use of insulin: Secondary | ICD-10-CM | POA: Insufficient documentation

## 2016-04-04 HISTORY — DX: Atherosclerotic heart disease of native coronary artery without angina pectoris: I25.10

## 2016-04-04 NOTE — Progress Notes (Signed)
Cardiac Individual Treatment Plan  Patient Details  Name: Gavin Lawson MRN: [id number] Date of Birth: 15-Dec-1951 Referring Provider:        CARDIAC REHAB PHASE II ORIENTATION from 04/04/2016 in [facility] CARD[facility]   Referring Provider  Cloria Spring MD      Initial Encounter Date:       CARDIAC REHAB PHASE II ORIENTATION from 04/04/2016 in [facility] CARDIAC [facility]HAB   Date  04/04/16   Referring Provider  Cloria Spring MD      Visit Diagnosis: S/P CABG x 3  Patient's Home Medications on Admission:  Current outpatient prescriptions:  .  acetaminophen (TYLENOL) 500 MG tablet, Take 1,000 mg by mouth 3 (three) times daily. , Disp: , Rfl:  .  aspirin 81 MG chewable tablet, Chew 162 mg by mouth daily., Disp: , Rfl:  .  carvedilol (COREG) 12.5 MG tablet, Take 12.5 mg by mouth 2 (two) times daily with a meal., Disp: , Rfl:  .  furosemide (LASIX) 40 MG tablet, Take 1 tablet (40 mg total) by mouth daily., Disp: 30 tablet, Rfl: 0 .  insulin NPH-regular Human (NOVOLIN 70/30) (70-30) 100 UNIT/ML injection, Inject 20-27 Units into the skin 2 (two) times daily with a meal. 27 units with breakfast and 23 units with dinner, Disp: , Rfl:  .  lisinopril (PRINIVIL,ZESTRIL) 5 MG tablet, Take 5 mg by mouth 2 (two) times daily. , Disp: , Rfl:  .  oxyCODONE (OXY IR/ROXICODONE) 5 MG immediate release tablet, Take 5 mg by mouth 2 (two) times daily as needed for severe pain., Disp: , Rfl:  .  pantoprazole (PROTONIX) 40 MG tablet, Take 40 mg by mouth daily., Disp: , Rfl:  .  pravastatin (PRAVACHOL) 40 MG tablet, Take 40 mg by mouth daily., Disp: , Rfl:  .  sennosides-docusate sodium (SENOKOT-S) 8.6-50 MG tablet, Take 2 tablets by mouth at bedtime., Disp: , Rfl:  .  traMADol (ULTRAM) 50 MG tablet, Take 50 mg by mouth 2 (two) times daily as needed., Disp: , Rfl:   Past Medical History: Past Medical History  Diagnosis Date  . COPD (chronic obstructive pulmonary  disease) (HCC)   . Hypertension   . Shortness of breath dyspnea   . Diabetes mellitus     INSULIN DEPENDENT  . Depression   . GERD (gastroesophageal reflux disease)   . Headache   . Arthritis   . Sciatic nerve pain   . Back pain   . Coronary artery disease 01/02/16    CABGx3 LIMA    Tobacco Use: History  Smoking status  . Never Smoker   Smokeless tobacco  . Never Used    Comment: exposed to second hand smoke     Labs:     Recent Review Flowsheet Data    Labs for ITP Cardiac and Pulmonary Rehab Latest Ref Rng 09/28/2015   Cholestrol 0 - 200 mg/dL 409(W)   LDLCALC 0 - 99 mg/dL 119(J)   HDL >47 mg/dL 52   Trlycerides <829 mg/dL 95   Hemoglobin F6O 4.8 - 5.6 % 9.5(H)      Capillary Blood Glucose: Lab Results  Component Value Date   GLUCAP 143* 09/29/2015   GLUCAP 184* 09/29/2015   GLUCAP 147* 09/28/2015   GLUCAP 151* 09/28/2015   GLUCAP 243* 09/28/2015     Exercise Target Goals: Date: 04/04/16  Exercise Program Goal: Individual exercise prescription set with THRR, safety & activity barriers. Participant demonstrates ability to understand and report RPE  using BORG scale, to self-measure pulse accurately, and to acknowledge the importance of the exercise prescription.  Exercise Prescription Goal: Starting with aerobic activity 30 plus minutes a day, 3 days per week for initial exercise prescription. Provide home exercise prescription and guidelines that participant acknowledges understanding prior to discharge.  Activity Barriers & Risk Stratification:     Activity Barriers & Cardiac Risk Stratification - 04/04/16 0909    Activity Barriers & Cardiac Risk Stratification   Activity Barriers Arthritis;Back Problems;Deconditioning;Joint Problems;Other (comment)   Comments possible R/L hip surgery   Cardiac Risk Stratification High      6 Minute Walk:     6 Minute Walk      04/04/16 1351       6 Minute Walk   Phase Initial     Distance 600 feet      Walk Time 5.3 minutes     # of Rest Breaks 2  14 sec and 32 secs     MPH 1.2     METS 0.93     RPE 11     VO2 Peak 3.3     Symptoms Yes (comment)     Comments 8/10 back pain     Resting HR 62 bpm     Resting BP 142/72 mmHg     Max Ex. HR 81 bpm     Max Ex. BP 146/82 mmHg     2 Minute Post BP 132/82 mmHg        Initial Exercise Prescription:     Initial Exercise Prescription - 04/04/16 1300    Date of Initial Exercise RX and Referring Provider   Date 04/04/16   Referring Provider Cloria SpringKutcher, Michael MD   NuStep   Level --   Minutes --   METs --   T5 Nustep   Level 2   Minutes 30   METs 1.5   Prescription Details   Frequency (times per week) 3   Duration Progress to 30 minutes of continuous aerobic without signs/symptoms of physical distress   Intensity   THRR 40-80% of Max Heartrate 63-126   Ratings of Perceived Exertion 11-13   Progression   Progression Continue to progress workloads to maintain intensity without signs/symptoms of physical distress.   Resistance Training   Training Prescription Yes   Weight 1   Reps 10-12      Perform Capillary Blood Glucose checks as needed.  Exercise Prescription Changes:   Exercise Comments:   Discharge Exercise Prescription (Final Exercise Prescription Changes):   Nutrition:  Target Goals: Understanding of nutrition guidelines, daily intake of sodium 1500mg , cholesterol 200mg , calories 30% from fat and 7% or less from saturated fats, daily to have 5 or more servings of fruits and vegetables.  Biometrics:     Pre Biometrics - 04/04/16 1416    Pre Biometrics   Waist Circumference 54 inches   Hip Circumference 53 inches   Waist to Hip Ratio 1.02 %   Triceps Skinfold 34 mm   % Body Fat 44 %   Grip Strength 37 kg   Flexibility 0 in   Single Leg Stand 0 seconds       Nutrition Therapy Plan and Nutrition Goals:     Nutrition Therapy & Goals - 04/04/16 1025    Nutrition Therapy   Diet Diabetic, Therapeutic  Lifestyle Changes   Personal Nutrition Goals   Personal Goal #1 Wt loss of 0.5-2 lb/week to a goal wt loss of 6-24 lb at graduation from Cardiac Rehab  Personal Goal #2 Improve blood glucose control as evidenced by a decrease in HbgA1c from 9.5 09/28/15 toward the desired goal of < 7.0   Intervention Plan   Intervention Prescribe, educate and counsel regarding individualized specific dietary modifications aiming towards targeted core components such as weight, hypertension, lipid management, diabetes, heart failure and other comorbidities.   Expected Outcomes Short Term Goal: Understand basic principles of dietary content, such as calories, fat, sodium, cholesterol and nutrients.;Long Term Goal: Adherence to prescribed nutrition plan.      Nutrition Discharge: Nutrition Scores:   Nutrition Goals Re-Evaluation:   Psychosocial: Target Goals: Acknowledge presence or absence of depression, maximize coping skills, provide positive support system. Participant is able to verbalize types and ability to use techniques and skills needed for reducing stress and depression.  Initial Review & Psychosocial Screening:     Initial Psych Review & Screening - 04/04/16 1623    Family Dynamics   Good Support System? Yes   Comments --  Mr Shahan has had changes in his family and work   Barriers   Psychosocial barriers to participate in program The patient should benefit from training in stress management and relaxation.   Screening Interventions   Interventions Encouraged to exercise      Quality of Life Scores:     Quality of Life - 04/04/16 1420    Quality of Life Scores   Health/Function Pre 18.75 %   Socioeconomic Pre 16.21 %   Psych/Spiritual Pre 13.71 %   Family Pre 24.38 %   GLOBAL Pre 17.73 %      PHQ-9:     Recent Review Flowsheet Data    There is no flowsheet data to display.      Psychosocial Evaluation and Intervention:   Psychosocial Re-Evaluation:   Vocational  Rehabilitation: Provide vocational rehab assistance to qualifying candidates.   Vocational Rehab Evaluation & Intervention:     Vocational Rehab - 04/04/16 1623    Initial Vocational Rehab Evaluation & Intervention   Assessment shows need for Vocational Rehabilitation No      Education: Education Goals: Education classes will be provided on a weekly basis, covering required topics. Participant will state understanding/return demonstration of topics presented.  Learning Barriers/Preferences:     Learning Barriers/Preferences - 04/04/16 1231    Learning Barriers/Preferences   Learning Preferences Individual Instruction;Verbal Instruction;Video;Written Material      Education Topics: Count Your Pulse:  -Group instruction provided by verbal instruction, demonstration, patient participation and written materials to support subject.  Instructors address importance of being able to find your pulse and how to count your pulse when at home without a heart monitor.  Patients get hands on experience counting their pulse with staff help and individually.   Heart Attack, Angina, and Risk Factor Modification:  -Group instruction provided by verbal instruction, video, and written materials to support subject.  Instructors address signs and symptoms of angina and heart attacks.    Also discuss risk factors for heart disease and how to make changes to improve heart health risk factors.   Functional Fitness:  -Group instruction provided by verbal instruction, demonstration, patient participation, and written materials to support subject.  Instructors address safety measures for doing things around the house.  Discuss how to get up and down off the floor, how to pick things up properly, how to safely get out of a chair without assistance, and balance training.   Meditation and Mindfulness:  -Group instruction provided by verbal instruction, patient participation, and written  materials to support  subject.  Instructor addresses importance of mindfulness and meditation practice to help reduce stress and improve awareness.  Instructor also leads participants through a meditation exercise.    Stretching for Flexibility and Mobility:  -Group instruction provided by verbal instruction, patient participation, and written materials to support subject.  Instructors lead participants through series of stretches that are designed to increase flexibility thus improving mobility.  These stretches are additional exercise for major muscle groups that are typically performed during regular warm up and cool down.   Hands Only CPR Anytime:  -Group instruction provided by verbal instruction, video, patient participation and written materials to support subject.  Instructors co-teach with AHA video for hands only CPR.  Participants get hands on experience with mannequins.   Nutrition I class: Heart Healthy Eating:  -Group instruction provided by PowerPoint slides, verbal discussion, and written materials to support subject matter. The instructor gives an explanation and review of the Therapeutic Lifestyle Changes diet recommendations, which includes a discussion on lipid goals, dietary fat, sodium, fiber, plant stanol/sterol esters, sugar, and the components of a well-balanced, healthy diet.   Nutrition II class: Lifestyle Skills:  -Group instruction provided by PowerPoint slides, verbal discussion, and written materials to support subject matter. The instructor gives an explanation and review of label reading, grocery shopping for heart health, heart healthy recipe modifications, and ways to make healthier choices when eating out.   Diabetes Question & Answer:  -Group instruction provided by PowerPoint slides, verbal discussion, and written materials to support subject matter. The instructor gives an explanation and review of diabetes co-morbidities, pre- and post-prandial blood glucose goals, pre-exercise  blood glucose goals, signs, symptoms, and treatment of hypoglycemia and hyperglycemia, and foot care basics.   Diabetes Blitz:  -Group instruction provided by PowerPoint slides, verbal discussion, and written materials to support subject matter. The instructor gives an explanation and review of the physiology behind type 1 and type 2 diabetes, diabetes medications and rational behind using different medications, pre- and post-prandial blood glucose recommendations and Hemoglobin A1c goals, diabetes diet, and exercise including blood glucose guidelines for exercising safely.    Portion Distortion:  -Group instruction provided by PowerPoint slides, verbal discussion, written materials, and food models to support subject matter. The instructor gives an explanation of serving size versus portion size, changes in portions sizes over the last 20 years, and what consists of a serving from each food group.   Stress Management:  -Group instruction provided by verbal instruction, video, and written materials to support subject matter.  Instructors review role of stress in heart disease and how to cope with stress positively.     Exercising on Your Own:  -Group instruction provided by verbal instruction, power point, and written materials to support subject.  Instructors discuss benefits of exercise, components of exercise, frequency and intensity of exercise, and end points for exercise.  Also discuss use of nitroglycerin and activating EMS.  Review options of places to exercise outside of rehab.  Review guidelines for sex with heart disease.   Cardiac Drugs I:  -Group instruction provided by verbal instruction and written materials to support subject.  Instructor reviews cardiac drug classes: antiplatelets, anticoagulants, beta blockers, and statins.  Instructor discusses reasons, side effects, and lifestyle considerations for each drug class.   Cardiac Drugs II:  -Group instruction provided by verbal  instruction and written materials to support subject.  Instructor reviews cardiac drug classes: angiotensin converting enzyme inhibitors (ACE-I), angiotensin II receptor blockers (ARBs), nitrates,  and calcium channel blockers.  Instructor discusses reasons, side effects, and lifestyle considerations for each drug class.   Anatomy and Physiology of the Circulatory System:  -Group instruction provided by verbal instruction, video, and written materials to support subject.  Reviews functional anatomy of heart, how it relates to various diagnoses, and what role the heart plays in the overall system.   Knowledge Questionnaire Score:     Knowledge Questionnaire Score - 04/04/16 1231    Knowledge Questionnaire Score   Pre Score 21/24      Core Components/Risk Factors/Patient Goals at Admission:     Personal Goals and Risk Factors at Admission - 04/04/16 0913    Core Components/Risk Factors/Patient Goals on Admission    Weight Management Yes;Obesity   Intervention Weight Management: Develop a combined nutrition and exercise program designed to reach desired caloric intake, while maintaining appropriate intake of nutrient and fiber, sodium and fats, and appropriate energy expenditure required for the weight goal.;Weight Management: Provide education and appropriate resources to help participant work on and attain dietary goals.;Obesity: Provide education and appropriate resources to help participant work on and attain dietary goals.   Admit Weight 300 lb 11.3 oz (136.4 kg)   Goal Weight: Short Term 295 lb (133.811 kg)   Goal Weight: Long Term 290 lb (131.543 kg)   Expected Outcomes Long Term: Adherence to nutrition and physical activity/exercise program aimed toward attainment of established weight goal;Short Term: Continue to assess and modify interventions until short term weight is achieved;Weight Loss: Understanding of general recommendations for a balanced deficit meal plan, which promotes 1-2  lb weight loss per week and includes a negative energy balance of [phone] kcal/d;Understanding recommendations for meals to include 15-35% energy as protein, 25-35% energy from fat, 35-60% energy from carbohydrates, less than  of dietary cholesterol, 20-35 gm of total fiber daily;Understanding of distribution of calorie intake throughout the day with the consumption of 4-5 meals/snacks   Sedentary Yes   Intervention Provide advice, education, support and counseling about physical activity/exercise needs.;Develop an individualized exercise prescription for aerobic and resistive training based on initial evaluation findings, risk stratification, comorbidities and participant's personal goals.   Expected Outcomes Achievement of increased cardiorespiratory fitness and enhanced flexibility, muscular endurance and strength shown through measurements of functional capacity and personal statement of participant.   Increase Strength and Stamina Yes   Intervention Provide advice, education, support and counseling about physical activity/exercise needs.;Develop an individualized exercise prescription for aerobic and resistive training based on initial evaluation findings, risk stratification, comorbidities and participant's personal goals.   Expected Outcomes Achievement of increased cardiorespiratory fitness and enhanced flexibility, muscular endurance and strength shown through measurements of functional capacity and personal statement of participant.   Improve shortness of breath with ADL's Yes   Intervention Provide education, individualized exercise plan and daily activity instruction to help decrease symptoms of SOB with activities of daily living.   Expected Outcomes Short Term: Achieves a reduction of symptoms when performing activities of daily living.   Diabetes Yes   Intervention Provide education about signs/symptoms and action to take for hypo/hyperglycemia.;Provide education about proper  nutrition, including hydration, and aerobic/resistive exercise prescription along with prescribed medications to achieve blood glucose in normal ranges: Fasting glucose 65-99 mg/dL   Expected Outcomes Short Term: Participant verbalizes understanding of the signs/symptoms and immediate care of hyper/hypoglycemia, proper foot care and importance of medication, aerobic/resistive exercise and nutrition plan for blood glucose control.;Long Term: Attainment of HbA1C < 7%.   Hypertension Yes  Intervention Provide education on lifestyle modifcations including regular physical activity/exercise, weight management, moderate sodium restriction and increased consumption of fresh fruit, vegetables, and low fat dairy, alcohol moderation, and smoking cessation.;Monitor prescription use compliance.   Expected Outcomes Short Term: Continued assessment and intervention until BP is < 140/57mm HG in hypertensive participants. < 130/47mm HG in hypertensive participants with diabetes, heart failure or chronic kidney disease.;Long Term: Maintenance of blood pressure at goal levels.   Lipids Yes   Intervention Provide education and support for participant on nutrition & aerobic/resistive exercise along with prescribed medications to achieve LDL 70mg , HDL >40mg .   Expected Outcomes Long Term: Cholesterol controlled with medications as prescribed, with individualized exercise RX and with personalized nutrition plan. Value goals: LDL < 70mg , HDL > 40 mg.;Short Term: Participant states understanding of desired cholesterol values and is compliant with medications prescribed. Participant is following exercise prescription and nutrition guidelines.   Stress Yes   Intervention Offer individual and/or small group education and counseling on adjustment to heart disease, stress management and health-related lifestyle change. Teach and support self-help strategies.   Expected Outcomes Short Term: Participant demonstrates changes in  health-related behavior, relaxation and other stress management skills, ability to obtain effective social support, and compliance with psychotropic medications if prescribed.;Long Term: Emotional wellbeing is indicated by absence of clinically significant psychosocial distress or social isolation.   Personal Goal Other Yes   Personal Goal Get better- be able to walk longer with less SOB   Intervention Provide exercise prescription in program and at home toincrease exercise strength and endurance   Expected Outcomes increased exercise tolerance, decreased pain in hip with increased movement       Core Components/Risk Factors/Patient Goals Review:    Core Components/Risk Factors/Patient Goals at Discharge (Final Review):    ITP Comments:     ITP Comments      04/02/16 1017           ITP Comments Dr. Armanda Magic, Medical Director          Comments:Patient attended orientation from 0800 to 1200 to review rules and guidelines for program. Completed 6 minute walk test, Intitial ITP, and exercise prescription.  VSS. Telemetry-Sinus Rhythm.  Asymptomatic. Mr Verrilli is deconditioned and uses a cane for assistance with Ambulation. Mr Robinson stopped twice to rest during his walk test, but tolerated his walk test without difficulty.Patient did not eat breakfast this morning. CBG was 251 with his home meter.Patient was given a snack and reminded to eat breakfast prior to coming to class next week. Dr Dorena Cookey is Mr [facility] cardiologist at [facility],   [place]ad[place] Lighter, RN,BSN 04/04/2016 4:33 PM

## 2016-04-04 NOTE — Progress Notes (Signed)
Cardiac Rehab Medication Review by a Pharmacist  Does the patient  feel that his/her medications are working for him/her?  yes  Has the patient been experiencing any side effects to the medications prescribed?  no  Does the patient measure his/her own blood pressure or blood glucose at home?  no   Does the patient have any problems obtaining medications due to transportation or finances?   no  Understanding of regimen: good Understanding of indications: good Potential of compliance: good  Pharmacist comments:  65 y/o M presents to cardiac rehab ambulating on a cane in good spirits. Endorses understanding of treatment regimen. Provided counseling to increase frequency of checking BP at home.   [place] PinkGazda, Shelva Hetzer P, PharmD 04/04/2016 8:48 AM

## 2016-04-08 ENCOUNTER — Encounter (HOSPITAL_COMMUNITY)
Admission: RE | Admit: 2016-04-08 | Discharge: 2016-04-08 | Disposition: A | Discharge status: Home / Self Care | Payer: BLUE CROSS/BLUE SHIELD | Source: Ambulatory Visit | Attending: Internal Medicine | Admitting: Internal Medicine

## 2016-04-08 DIAGNOSIS — Z951 Presence of aortocoronary bypass graft: Secondary | ICD-10-CM | POA: Diagnosis not present

## 2016-04-08 LAB — GLUCOSE, CAPILLARY
Glucose-Capillary: 196 mg/dL — ABNORMAL HIGH (ref 65–99)
Glucose-Capillary: 151 mg/dL — ABNORMAL HIGH (ref 65–99)

## 2016-04-08 NOTE — Progress Notes (Signed)
Daily Session Note  Patient Details  Name: Gavin Lawson MRN: [id number] Date of Birth: January 14, 1951 Referring Provider:            CARDIAC REHAB PHASE II ORIENTATION from 04/04/2016 in [place]   Referring Provider  Curly Rim MD      Encounter Date: 04/08/2016  Check In:     Session Check In - 04/08/16 1038    Check-In   Location [facility]   Staff Present Andi Hence, RN, Clinical cytogeneticist, RN, [facility], MS, ACSM RCEP, Exercise Physiologist;[name], MA, ACSM RCEP, Exercise Physiologist;[name], RN, BSN   Supervising physician immediately available to respond to emergencies [facility] immediately available   Physician(s) Dr. Marily Memos   Medication changes reported     No   Fall or balance concerns reported    No   Warm-up and Cool-down Performed on first and last piece of equipment   Resistance Training Performed Yes   VAD Patient? No   Pain Assessment   Currently in Pain? No/denies      Capillary Blood Glucose: No results found for this or any previous visit (from the past 24 hour(s)).      Exercise Prescription Changes - 04/08/16 1300    Exercise Review   Progression No  Pt started exercise 4/24   Response to Exercise   Blood Pressure (Admit) 134/72 mmHg   Blood Pressure (Exercise) 148/70 mmHg   Blood Pressure (Exit) 138/80 mmHg   Heart Rate (Admit) 67 bpm   Heart Rate (Exercise) 71 bpm   Heart Rate (Exit) 55 bpm   Rating of Perceived Exertion (Exercise) 11   Symptoms Chronic hip/leg pain   Duration Progress to 30 minutes of continuous aerobic without signs/symptoms of physical distress   Intensity THRR unchanged   Progression   Progression Continue to progress workloads to maintain intensity without signs/symptoms of physical distress.   Average METs 1.8   Resistance Training   Training Prescription Yes   Weight 1   Reps 10-12   Interval Training   Interval Training  No   T5 Nustep   Level 2   Minutes 20   METs 1.5     Goals Met:  No report of cardiac concerns or symptoms  Goals Unmet:  Not Applicable  Comments: Pt started cardiac rehab today.  Pt tolerated light exercise without difficulty. VSS, telemetry-Sinus rhythm with a first degree heart block this has been previously documented, asymptomatic.  Medication list reconciled. Pt denies barriers to medicaiton compliance.  PSYCHOSOCIAL ASSESSMENT:  PHQ-2.Marland Kitchen    Pt enjoys watching TV and driving.   Pt oriented to exercise equipment and routine.    Understanding verbalized. [name] used a Marketing executive for stability. Patient tolerated light exercise on the nustep and cool down without complaints.    Dr. Fransico Him is Medical Director for Cardiac Rehab at [facility].

## 2016-04-10 ENCOUNTER — Encounter (HOSPITAL_COMMUNITY)
Admission: RE | Admit: 2016-04-10 | Discharge: 2016-04-10 | Disposition: A | Discharge status: Home / Self Care | Payer: BLUE CROSS/BLUE SHIELD | Source: Ambulatory Visit | Attending: Internal Medicine | Admitting: Internal Medicine

## 2016-04-10 DIAGNOSIS — Z951 Presence of aortocoronary bypass graft: Secondary | ICD-10-CM | POA: Diagnosis not present

## 2016-04-10 NOTE — Progress Notes (Signed)
QUALITY OF LIFE SCORE REVIEW  Pt completed Quality of Life survey as a participant in Cardiac Rehab. Scores 21.0 or below are considered low. Pt score very low in several areas Overall 17.73, Health and Function 18.75, socioeconomic 16.21, physiological and spiritual 13.71, family 24.38 . Patient quality of life slightly altered by physical constraints which limits ability to perform as prior to recent cardiac illness.  Reviewed Gavin Lawson's quality of life questionnaire.Gavin Gavin RichtersHick's is dissatisfied with his health due to his being overweight and his recent open heart surgery.  Offered emotional support and reassurance.  Will continue to monitor and intervene as necessary.  Gavin Lawson's denies being depressed. I did offer to set up a meeting with our hospital chaplain. Gavin Lawson declined at this time. Will forward Gavin Lawson's quality of life questionnaire to [facility]

## 2016-04-11 LAB — GLUCOSE, CAPILLARY
GLUCOSE-CAPILLARY: 211 mg/dL — AB (ref 65–99)
Glucose-Capillary: 205 mg/dL — ABNORMAL HIGH (ref 65–99)

## 2016-04-11 NOTE — Progress Notes (Signed)
Cardiac Individual Treatment Plan  Patient Details  Name: Gavin Lawson MRN: [id number] Date of Birth: 1951-03-04 Referring Provider:        CARDIAC REHAB PHASE II ORIENTATION from 04/04/2016 in [facility]   Referring Provider  Cloria Spring MD      Initial Encounter Date:       CARDIAC REHAB PHASE II ORIENTATION from 04/04/2016 in [facility] CARDIAC REHAB   Date  04/04/16   Referring Provider  Cloria Spring MD      Visit Diagnosis: S/P CABG x 3  Patient's Home Medications on Admission:  Current outpatient prescriptions:  .  acetaminophen (TYLENOL) 500 MG tablet, Take 1,000 mg by mouth 3 (three) times daily. , Disp: , Rfl:  .  aspirin 81 MG chewable tablet, Chew 162 mg by mouth daily., Disp: , Rfl:  .  carvedilol (COREG) 12.5 MG tablet, Take 12.5 mg by mouth 2 (two) times daily with a meal., Disp: , Rfl:  .  furosemide (LASIX) 40 MG tablet, Take 1 tablet (40 mg total) by mouth daily., Disp: 30 tablet, Rfl: 0 .  insulin NPH-regular Human (NOVOLIN 70/30) (70-30) 100 UNIT/ML injection, Inject 20-27 Units into the skin 2 (two) times daily with a meal. 27 units with breakfast and 23 units with dinner, Disp: , Rfl:  .  lisinopril (PRINIVIL,ZESTRIL) 5 MG tablet, Take 5 mg by mouth 2 (two) times daily. , Disp: , Rfl:  .  oxyCODONE (OXY IR/ROXICODONE) 5 MG immediate release tablet, Take 5 mg by mouth 2 (two) times daily as needed for severe pain., Disp: , Rfl:  .  pantoprazole (PROTONIX) 40 MG tablet, Take 40 mg by mouth daily., Disp: , Rfl:  .  pravastatin (PRAVACHOL) 40 MG tablet, Take 40 mg by mouth daily., Disp: , Rfl:  .  sennosides-docusate sodium (SENOKOT-S) 8.6-50 MG tablet, Take 2 tablets by mouth at bedtime., Disp: , Rfl:  .  traMADol (ULTRAM) 50 MG tablet, Take 50 mg by mouth 2 (two) times daily as needed. Reported on 04/08/2016, Disp: , Rfl:   Past Medical History: Past Medical History  Diagnosis Date  . COPD (chronic  obstructive pulmonary disease) (HCC)   . Hypertension   . Shortness of breath dyspnea   . Diabetes mellitus     INSULIN DEPENDENT  . Depression   . GERD (gastroesophageal reflux disease)   . Headache   . Arthritis   . Sciatic nerve pain   . Back pain   . Coronary artery disease 01/02/16    CABGx3 LIMA    Tobacco Use: History  Smoking status  . Never Smoker   Smokeless tobacco  . Never Used    Comment: exposed to second hand smoke     Labs: Recent Review Flowsheet Data    Labs for ITP Cardiac and Pulmonary Rehab Latest Ref Rng 09/28/2015   Cholestrol 0 - 200 mg/dL 409(W)   LDLCALC 0 - 99 mg/dL 119(J)   HDL >47 mg/dL 52   Trlycerides <829 mg/dL 95   Hemoglobin F6O 4.8 - 5.6 % 9.5(H)      Capillary Blood Glucose: Lab Results  Component Value Date   GLUCAP 205* 04/10/2016   GLUCAP 211* 04/10/2016   GLUCAP 151* 04/08/2016   GLUCAP 196* 04/08/2016   GLUCAP 143* 09/29/2015     Exercise Target Goals:    Exercise Program Goal: Individual exercise prescription set with THRR, safety & activity barriers. Participant demonstrates ability to understand and report RPE using  BORG scale, to self-measure pulse accurately, and to acknowledge the importance of the exercise prescription.  Exercise Prescription Goal: Starting with aerobic activity 30 plus minutes a day, 3 days per week for initial exercise prescription. Provide home exercise prescription and guidelines that participant acknowledges understanding prior to discharge.  Activity Barriers & Risk Stratification:     Activity Barriers & Cardiac Risk Stratification - 04/04/16 0909    Activity Barriers & Cardiac Risk Stratification   Activity Barriers Arthritis;Back Problems;Deconditioning;Joint Problems;Other (comment)   Comments possible R/L hip surgery   Cardiac Risk Stratification High      6 Minute Walk:     6 Minute Walk      04/04/16 1351       6 Minute Walk   Phase Initial     Distance 600 feet      Walk Time 5.3 minutes     # of Rest Breaks 2  14 sec and 32 secs     MPH 1.2     METS 0.93     RPE 11     VO2 Peak 3.3     Symptoms Yes (comment)     Comments 8/10 back pain     Resting HR 62 bpm     Resting BP 142/72 mmHg     Max Ex. HR 81 bpm     Max Ex. BP 146/82 mmHg     2 Minute Post BP 132/82 mmHg        Initial Exercise Prescription:     Initial Exercise Prescription - 04/04/16 1300    Date of Initial Exercise RX and Referring Provider   Date 04/04/16   Referring Provider Cloria Spring MD   NuStep   Level --   Minutes --   METs --   T5 Nustep   Level 2   Minutes 30   METs 1.5   Prescription Details   Frequency (times per week) 3   Duration Progress to 30 minutes of continuous aerobic without signs/symptoms of physical distress   Intensity   THRR 40-80% of Max Heartrate 63-126   Ratings of Perceived Exertion 11-13   Progression   Progression Continue to progress workloads to maintain intensity without signs/symptoms of physical distress.   Resistance Training   Training Prescription Yes   Weight 1   Reps 10-12      Perform Capillary Blood Glucose checks as needed.  Exercise Prescription Changes:     Exercise Prescription Changes      04/08/16 1300           Exercise Review   Progression No  Pt started exercise 4/24       Response to Exercise   Blood Pressure (Admit) 134/72 mmHg       Blood Pressure (Exercise) 148/70 mmHg       Blood Pressure (Exit) 138/80 mmHg       Heart Rate (Admit) 67 bpm       Heart Rate (Exercise) 71 bpm       Heart Rate (Exit) 55 bpm       Rating of Perceived Exertion (Exercise) 11       Symptoms Chronic hip/leg pain       Duration Progress to 30 minutes of continuous aerobic without signs/symptoms of physical distress       Intensity THRR unchanged       Progression   Progression Continue to progress workloads to maintain intensity without signs/symptoms of physical distress.  Average METs 1.8        Resistance Training   Training Prescription Yes       Weight 1       Reps 10-12       Interval Training   Interval Training No       T5 Nustep   Level 2       Minutes 20       METs 1.5          Exercise Comments:     Exercise Comments      04/08/16 1354           Exercise Comments Pt started exercise today.  He is going to be limited by his right leg and hip.  Encouraged rest breaks to ease pain.          Discharge Exercise Prescription (Final Exercise Prescription Changes):     Exercise Prescription Changes - 04/08/16 1300    Exercise Review   Progression No  Pt started exercise 4/24   Response to Exercise   Blood Pressure (Admit) 134/72 mmHg   Blood Pressure (Exercise) 148/70 mmHg   Blood Pressure (Exit) 138/80 mmHg   Heart Rate (Admit) 67 bpm   Heart Rate (Exercise) 71 bpm   Heart Rate (Exit) 55 bpm   Rating of Perceived Exertion (Exercise) 11   Symptoms Chronic hip/leg pain   Duration Progress to 30 minutes of continuous aerobic without signs/symptoms of physical distress   Intensity THRR unchanged   Progression   Progression Continue to progress workloads to maintain intensity without signs/symptoms of physical distress.   Average METs 1.8   Resistance Training   Training Prescription Yes   Weight 1   Reps 10-12   Interval Training   Interval Training No   T5 Nustep   Level 2   Minutes 20   METs 1.5      Nutrition:  Target Goals: Understanding of nutrition guidelines, daily intake of sodium 1500mg , cholesterol 200mg , calories 30% from fat and 7% or less from saturated fats, daily to have 5 or more servings of fruits and vegetables.  Biometrics:     Pre Biometrics - 04/04/16 1416    Pre Biometrics   Waist Circumference 54 inches   Hip Circumference 53 inches   Waist to Hip Ratio 1.02 %   Triceps Skinfold 34 mm   % Body Fat 44 %   Grip Strength 37 kg   Flexibility 0 in   Single Leg Stand 0 seconds       Nutrition Therapy Plan and  Nutrition Goals:     Nutrition Therapy & Goals - 04/04/16 1025    Nutrition Therapy   Diet Diabetic, Therapeutic Lifestyle Changes   Personal Nutrition Goals   Personal Goal #1 Wt loss of 0.5-2 lb/week to a goal wt loss of 6-24 lb at graduation from Cardiac Rehab   Personal Goal #2 Improve blood glucose control as evidenced by a decrease in HbgA1c from 9.5 09/28/15 toward the desired goal of < 7.0   Intervention Plan   Intervention Prescribe, educate and counsel regarding individualized specific dietary modifications aiming towards targeted core components such as weight, hypertension, lipid management, diabetes, heart failure and other comorbidities.   Expected Outcomes Short Term Goal: Understand basic principles of dietary content, such as calories, fat, sodium, cholesterol and nutrients.;Long Term Goal: Adherence to prescribed nutrition plan.      Nutrition Discharge: Nutrition Scores:     Nutrition Assessments - 04/10/16 1043  MEDFICTS Scores   Pre Score 63  will verify score with pt      Nutrition Goals Re-Evaluation:   Psychosocial: Target Goals: Acknowledge presence or absence of depression, maximize coping skills, provide positive support system. Participant is able to verbalize types and ability to use techniques and skills needed for reducing stress and depression.  Initial Review & Psychosocial Screening:     Initial Psych Review & Screening - 04/10/16 1016    Family Dynamics   Good Support System? No   Strains Intra-family strains   Comments Patient says he has some financial and family issues he did not want to elaborate on.      Quality of Life Scores:     Quality of Life - 04/11/16 1312    Quality of Life Scores   Health/Function Pre --  Patient does not feel good about his health due to being overwieight and his CAD.   Socioeconomic Pre --  Patient lives with a friend but does not have a lot of friends outside his home   Psych/Spiritual Pre --   Patient has a strong belief in God   GLOBAL Pre --  Will forward quality of life to patients cardiologist      PHQ-9:     Recent Review Flowsheet Data    Depression screen [facility] 2/9 04/08/2016   Decreased Interest 1   Down, Depressed, Hopeless 1   PHQ - 2 Score 2   Altered sleeping 0   Tired, decreased energy 0   Change in appetite 0   Feeling bad or failure about yourself  1   Trouble concentrating 1    Moving slowly or fidgety/restless 0   Suicidal thoughts 0   PHQ-9 Score 4   Difficult doing work/chores Somewhat difficult      Psychosocial Evaluation and Intervention:   Psychosocial Re-Evaluation:   Vocational Rehabilitation: Provide vocational rehab assistance to qualifying candidates.   Vocational Rehab Evaluation & Intervention:     Vocational Rehab - 04/04/16 1623    Initial Vocational Rehab Evaluation & Intervention   Assessment shows need for Vocational Rehabilitation No      Education: Education Goals: Education classes will be provided on a weekly basis, covering required topics. Participant will state understanding/return demonstration of topics presented.  Learning Barriers/Preferences:     Learning Barriers/Preferences - 04/04/16 1231    Learning Barriers/Preferences   Learning Preferences Individual Instruction;Verbal Instruction;Video;Written Material      Education Topics: Count Your Pulse:  -Group instruction provided by verbal instruction, demonstration, patient participation and written materials to support subject.  Instructors address importance of being able to find your pulse and how to count your pulse when at home without a heart monitor.  Patients get hands on experience counting their pulse with staff help and individually.   Heart Attack, Angina, and Risk Factor Modification:  -Group instruction provided by verbal instruction, video, and written materials to support subject.  Instructors address signs and symptoms of angina and  heart attacks.    Also discuss risk factors for heart disease and how to make changes to improve heart health risk factors.   Functional Fitness:  -Group instruction provided by verbal instruction, demonstration, patient participation, and written materials to support subject.  Instructors address safety measures for doing things around the house.  Discuss how to get up and down off the floor, how to pick things up properly, how to safely get out of a chair without assistance, and balance training.   Meditation  and Mindfulness:  -Group instruction provided by verbal instruction, patient participation, and written materials to support subject.  Instructor addresses importance of mindfulness and meditation practice to help reduce stress and improve awareness.  Instructor also leads participants through a meditation exercise.    Stretching for Flexibility and Mobility:  -Group instruction provided by verbal instruction, patient participation, and written materials to support subject.  Instructors lead participants through series of stretches that are designed to increase flexibility thus improving mobility.  These stretches are additional exercise for major muscle groups that are typically performed during regular warm up and cool down.   Hands Only CPR Anytime:  -Group instruction provided by verbal instruction, video, patient participation and written materials to support subject.  Instructors co-teach with AHA video for hands only CPR.  Participants get hands on experience with mannequins.   Nutrition I class: Heart Healthy Eating:  -Group instruction provided by PowerPoint slides, verbal discussion, and written materials to support subject matter. The instructor gives an explanation and review of the Therapeutic Lifestyle Changes diet recommendations, which includes a discussion on lipid goals, dietary fat, sodium, fiber, plant stanol/sterol esters, sugar, and the components of a well-balanced,  healthy diet.   Nutrition II class: Lifestyle Skills:  -Group instruction provided by PowerPoint slides, verbal discussion, and written materials to support subject matter. The instructor gives an explanation and review of label reading, grocery shopping for heart health, heart healthy recipe modifications, and ways to make healthier choices when eating out.   Diabetes Question & Answer:  -Group instruction provided by PowerPoint slides, verbal discussion, and written materials to support subject matter. The instructor gives an explanation and review of diabetes co-morbidities, pre- and post-prandial blood glucose goals, pre-exercise blood glucose goals, signs, symptoms, and treatment of hypoglycemia and hyperglycemia, and foot care basics.   Diabetes Blitz:  -Group instruction provided by PowerPoint slides, verbal discussion, and written materials to support subject matter. The instructor gives an explanation and review of the physiology behind type 1 and type 2 diabetes, diabetes medications and rational behind using different medications, pre- and post-prandial blood glucose recommendations and Hemoglobin A1c goals, diabetes diet, and exercise including blood glucose guidelines for exercising safely.    Portion Distortion:  -Group instruction provided by PowerPoint slides, verbal discussion, written materials, and food models to support subject matter. The instructor gives an explanation of serving size versus portion size, changes in portions sizes over the last 20 years, and what consists of a serving from each food group.   Stress Management:  -Group instruction provided by verbal instruction, video, and written materials to support subject matter.  Instructors review role of stress in heart disease and how to cope with stress positively.     Exercising on Your Own:  -Group instruction provided by verbal instruction, power point, and written materials to support subject.  Instructors  discuss benefits of exercise, components of exercise, frequency and intensity of exercise, and end points for exercise.  Also discuss use of nitroglycerin and activating EMS.  Review options of places to exercise outside of rehab.  Review guidelines for sex with heart disease.   Cardiac Drugs I:  -Group instruction provided by verbal instruction and written materials to support subject.  Instructor reviews cardiac drug classes: antiplatelets, anticoagulants, beta blockers, and statins.  Instructor discusses reasons, side effects, and lifestyle considerations for each drug class.   Cardiac Drugs II:  -Group instruction provided by verbal instruction and written materials to support subject.  Instructor reviews cardiac drug  classes: angiotensin converting enzyme inhibitors (ACE-I), angiotensin II receptor blockers (ARBs), nitrates, and calcium channel blockers.  Instructor discusses reasons, side effects, and lifestyle considerations for each drug class.   Anatomy and Physiology of the Circulatory System:  -Group instruction provided by verbal instruction, video, and written materials to support subject.  Reviews functional anatomy of heart, how it relates to various diagnoses, and what role the heart plays in the overall system.   Knowledge Questionnaire Score:     Knowledge Questionnaire Score - 04/10/16 1044    Knowledge Questionnaire Score   Pre Score 21/24               DM 13/15      Core Components/Risk Factors/Patient Goals at Admission:     Personal Goals and Risk Factors at Admission - 04/04/16 0913    Core Components/Risk Factors/Patient Goals on Admission    Weight Management Yes;Obesity   Intervention Weight Management: Develop a combined nutrition and exercise program designed to reach desired caloric intake, while maintaining appropriate intake of nutrient and fiber, sodium and fats, and appropriate energy expenditure required for the weight goal.;Weight Management: Provide  education and appropriate resources to help participant work on and attain dietary goals.;Obesity: Provide education and appropriate resources to help participant work on and attain dietary goals.   Admit Weight 300 lb 11.3 oz (136.4 kg)   Goal Weight: Short Term 295 lb (133.811 kg)   Goal Weight: Long Term 290 lb (131.543 kg)   Expected Outcomes Long Term: Adherence to nutrition and physical activity/exercise program aimed toward attainment of established weight goal;Short Term: Continue to assess and modify interventions until short term weight is achieved;Weight Loss: Understanding of general recommendations for a balanced deficit meal plan, which promotes 1-2 lb weight loss per week and includes a negative energy balance of [phone] kcal/d;Understanding recommendations for meals to include 15-35% energy as protein, 25-35% energy from fat, 35-60% energy from carbohydrates, less than 200mg  of dietary cholesterol, 20-35 gm of total fiber daily;Understanding of distribution of calorie intake throughout the day with the consumption of 4-5 meals/snacks   Sedentary Yes   Intervention Provide advice, education, support and counseling about physical activity/exercise needs.;Develop an individualized exercise prescription for aerobic and resistive training based on initial evaluation findings, risk stratification, comorbidities and participant's personal goals.   Expected Outcomes Achievement of increased cardiorespiratory fitness and enhanced flexibility, muscular endurance and strength shown through measurements of functional capacity and personal statement of participant.   Increase Strength and Stamina Yes   Intervention Provide advice, education, support and counseling about physical activity/exercise needs.;Develop an individualized exercise prescription for aerobic and resistive training based on initial evaluation findings, risk stratification, comorbidities and participant's personal goals.   Expected  Outcomes Achievement of increased cardiorespiratory fitness and enhanced flexibility, muscular endurance and strength shown through measurements of functional capacity and personal statement of participant.   Improve shortness of breath with ADL's Yes   Intervention Provide education, individualized exercise plan and daily activity instruction to help decrease symptoms of SOB with activities of daily living.   Expected Outcomes Short Term: Achieves a reduction of symptoms when performing activities of daily living.   Diabetes Yes   Intervention Provide education about signs/symptoms and action to take for hypo/hyperglycemia.;Provide education about proper nutrition, including hydration, and aerobic/resistive exercise prescription along with prescribed medications to achieve blood glucose in normal ranges: Fasting glucose 65-99 mg/dL   Expected Outcomes Short Term: Participant verbalizes understanding of the signs/symptoms and immediate care of hyper/hypoglycemia,  proper foot care and importance of medication, aerobic/resistive exercise and nutrition plan for blood glucose control.;Long Term: Attainment of HbA1C < 7%.   Hypertension Yes   Intervention Provide education on lifestyle modifcations including regular physical activity/exercise, weight management, moderate sodium restriction and increased consumption of fresh fruit, vegetables, and low fat dairy, alcohol moderation, and smoking cessation.;Monitor prescription use compliance.   Expected Outcomes Short Term: Continued assessment and intervention until BP is < 140/25mm HG in hypertensive participants. < 130/54mm HG in hypertensive participants with diabetes, heart failure or chronic kidney disease.;Long Term: Maintenance of blood pressure at goal levels.   Lipids Yes   Intervention Provide education and support for participant on nutrition & aerobic/resistive exercise along with prescribed medications to achieve LDL 70mg , HDL >40mg .   Expected  Outcomes Long Term: Cholesterol controlled with medications as prescribed, with individualized exercise RX and with personalized nutrition plan. Value goals: LDL < 70mg , HDL > 40 mg.;Short Term: Participant states understanding of desired cholesterol values and is compliant with medications prescribed. Participant is following exercise prescription and nutrition guidelines.   Stress Yes   Intervention Offer individual and/or small group education and counseling on adjustment to heart disease, stress management and health-related lifestyle change. Teach and support self-help strategies.   Expected Outcomes Short Term: Participant demonstrates changes in health-related behavior, relaxation and other stress management skills, ability to obtain effective social support, and compliance with psychotropic medications if prescribed.;Long Term: Emotional wellbeing is indicated by absence of clinically significant psychosocial distress or social isolation.   Personal Goal Other Yes   Personal Goal Get better- be able to walk longer with less SOB   Intervention Provide exercise prescription in program and at home toincrease exercise strength and endurance   Expected Outcomes increased exercise tolerance, decreased pain in hip with increased movement       Core Components/Risk Factors/Patient Goals Review:    Core Components/Risk Factors/Patient Goals at Discharge (Final Review):    ITP Comments:     ITP Comments      04/02/16 1017           ITP Comments Dr. Armanda Magic, Medical Director          Comments: Pt is making expected progress toward personal goals after completing 3 sessions. Recommend continued exercise and life style modification education including  stress management and relaxation techniques to decrease cardiac risk profile.

## 2016-04-12 ENCOUNTER — Encounter (HOSPITAL_COMMUNITY)
Admission: RE | Admit: 2016-04-12 | Discharge: 2016-04-12 | Disposition: A | Discharge status: Home / Self Care | Payer: BLUE CROSS/BLUE SHIELD | Source: Ambulatory Visit | Attending: Internal Medicine | Admitting: Internal Medicine

## 2016-04-12 DIAGNOSIS — Z951 Presence of aortocoronary bypass graft: Secondary | ICD-10-CM | POA: Diagnosis not present

## 2016-04-12 LAB — GLUCOSE, CAPILLARY
GLUCOSE-CAPILLARY: 210 mg/dL — AB (ref 65–99)
GLUCOSE-CAPILLARY: 177 mg/dL — AB (ref 65–99)

## 2016-04-15 ENCOUNTER — Encounter (HOSPITAL_COMMUNITY)
Admission: RE | Admit: 2016-04-15 | Discharge: 2016-04-15 | Disposition: A | Discharge status: Home / Self Care | Payer: BLUE CROSS/BLUE SHIELD | Source: Ambulatory Visit | Attending: Internal Medicine | Admitting: Internal Medicine

## 2016-04-15 DIAGNOSIS — Z79899 Other long term (current) drug therapy: Secondary | ICD-10-CM | POA: Insufficient documentation

## 2016-04-15 DIAGNOSIS — J449 Chronic obstructive pulmonary disease, unspecified: Secondary | ICD-10-CM | POA: Insufficient documentation

## 2016-04-15 DIAGNOSIS — Z7982 Long term (current) use of aspirin: Secondary | ICD-10-CM | POA: Diagnosis not present

## 2016-04-15 DIAGNOSIS — Z951 Presence of aortocoronary bypass graft: Secondary | ICD-10-CM | POA: Diagnosis present

## 2016-04-15 DIAGNOSIS — I2510 Atherosclerotic heart disease of native coronary artery without angina pectoris: Secondary | ICD-10-CM | POA: Insufficient documentation

## 2016-04-15 DIAGNOSIS — E119 Type 2 diabetes mellitus without complications: Secondary | ICD-10-CM | POA: Diagnosis not present

## 2016-04-15 DIAGNOSIS — I10 Essential (primary) hypertension: Secondary | ICD-10-CM | POA: Diagnosis not present

## 2016-04-15 DIAGNOSIS — K219 Gastro-esophageal reflux disease without esophagitis: Secondary | ICD-10-CM | POA: Insufficient documentation

## 2016-04-15 DIAGNOSIS — Z794 Long term (current) use of insulin: Secondary | ICD-10-CM | POA: Insufficient documentation

## 2016-04-15 LAB — GLUCOSE, CAPILLARY
GLUCOSE-CAPILLARY: 204 mg/dL — AB (ref 65–99)
Glucose-Capillary: 187 mg/dL — ABNORMAL HIGH (ref 65–99)

## 2016-04-15 NOTE — Progress Notes (Addendum)
Gavin Lawson 65 y.o. male Nutrition Note Spoke with pt. Nutrition Plan and Nutrition Survey goals reviewed with pt. Pt is following Step 1 of the Therapeutic Lifestyle Changes diet. Pt wants to lose wt. Pt states "They say I can't have hip surgery if I don't lose some weight." Pt is diabetic. Last A1c indicates blood glucose poorly controlled. Per discussion, pt A1c "8 something last time and it was drawn again but I haven't received the results yet." This writer went over Diabetes Education test results. Pt checks CBG's 1-2 times a day. Fasting CBG's reportedly "in the 200's" mg/dL. Pt reports h/o "hypoglycemia" 1 time a week for the past 3 weeks. Pt checked his CBG 1 out of 3 times and CBG was reportedly 83 mg/dL. Pt likely feels hypoglycemia s/s early due to chronic hyperglycemia. Pt "usually" treats hypoglycemia with a candy bar, candy, or juice. Recommended method to treat "hypoglycemia" discussed. Pt denies financial difficulty buying medication or food. Pt states, "I'm receiving my retirement benefits now which takes care of me." Pt did not have health insurance toward the end of last year, which, per pt, resulted in pt stopping medications.Pt c/o chronic hip and back pain, which pt does not feel is well managed on Tramadol and Acetaminophen. Pt expressed understanding of the information reviewed. Pt aware of nutrition education classes offered and reports he has attended "a lot of nutrition classes." Pt having difficulty with glacoma and DM retinopathy and states he can read material under a magnifying glass.   Lab Results  Component Value Date   HGBA1C 9.5* 09/28/2015   Wt Readings from Last 3 Encounters:  04/04/16 300 lb 11.3 oz (136.4 kg)  09/29/15 293 lb 3.4 oz (133 kg)    Nutrition Diagnosis ? Food-and nutrition-related knowledge deficit related to lack of exposure to information as related to diagnosis of: ? CVD ? DM  ? Obesity related to excessive energy intake as evidenced by a BMI of  45.2  Nutrition RX/ Estimated Daily Nutrition Needs for: wt loss 2000-2300 Kcal, 55-60 gm fat, 13-15 gm sat fat, 2.0-2.3 gm trans-fat, <1500 mg sodium, 250 gm CHO   Nutrition Intervention ? Pt's individual nutrition plan reviewed with pt. ? Benefits of adopting Therapeutic Lifestyle Changes discussed when Medficts reviewed. ? Pt to attend the Portion Distortion class ? Pt to attend the Diabetes Q & A class  ? Pt given handouts for: ? Nutrition I class ? Nutrition II class ? Diabetes Blitz Class ? Continue client-centered nutrition education by RD, as part of interdisciplinary care. Goal(s) ? Pt to identify and limit food sources of saturated fat, trans fat, and sodium ? Pt to identify food quantities necessary to achieve weight loss of 6-24 lb (2.7-10.9 kg) at graduation from cardiac rehab.  ? CBG concentrations in the normal range or as close to normal as is safely possible. Monitor and Evaluate progress toward nutrition goal with team. Gavin PlumbEdna Ainslee Lawson, M.Ed, RD, LDN, CDE 04/15/2016 10:54 AM

## 2016-04-15 NOTE — Progress Notes (Signed)
Reviewed home exercise with pt today.  Pt plans to walk and do chair exercises at home home for exercise.  Reviewed THR, pulse, RPE, sign and symptoms, and when to call 911 or MD.  Also discussed weather considerations and indoor options.  Pt voiced understanding. Fabio PierceJessica Quay Simkin, MA, ACSM RCEP 04/15/2016 11:34 AM

## 2016-04-17 ENCOUNTER — Encounter (HOSPITAL_COMMUNITY)
Admission: RE | Admit: 2016-04-17 | Discharge: 2016-04-17 | Disposition: A | Discharge status: Home / Self Care | Payer: BLUE CROSS/BLUE SHIELD | Source: Ambulatory Visit | Attending: Internal Medicine | Admitting: Internal Medicine

## 2016-04-17 DIAGNOSIS — Z951 Presence of aortocoronary bypass graft: Secondary | ICD-10-CM

## 2016-04-17 LAB — GLUCOSE, CAPILLARY
GLUCOSE-CAPILLARY: 222 mg/dL — AB (ref 65–99)
Glucose-Capillary: 247 mg/dL — ABNORMAL HIGH (ref 65–99)

## 2016-04-19 ENCOUNTER — Encounter (HOSPITAL_COMMUNITY)
Admission: RE | Admit: 2016-04-19 | Discharge: 2016-04-19 | Disposition: A | Discharge status: Home / Self Care | Payer: BLUE CROSS/BLUE SHIELD | Source: Ambulatory Visit | Attending: Internal Medicine | Admitting: Internal Medicine

## 2016-04-19 DIAGNOSIS — Z951 Presence of aortocoronary bypass graft: Secondary | ICD-10-CM | POA: Diagnosis not present

## 2016-04-19 LAB — GLUCOSE, CAPILLARY: GLUCOSE-CAPILLARY: 110 mg/dL — AB (ref 65–99)

## 2016-04-22 ENCOUNTER — Encounter (HOSPITAL_COMMUNITY)
Admission: RE | Admit: 2016-04-22 | Discharge: 2016-04-22 | Disposition: A | Discharge status: Home / Self Care | Payer: BLUE CROSS/BLUE SHIELD | Source: Ambulatory Visit | Attending: Internal Medicine | Admitting: Internal Medicine

## 2016-04-22 DIAGNOSIS — Z951 Presence of aortocoronary bypass graft: Secondary | ICD-10-CM

## 2016-04-22 LAB — GLUCOSE, CAPILLARY
GLUCOSE-CAPILLARY: 103 mg/dL — AB (ref 65–99)
GLUCOSE-CAPILLARY: 91 mg/dL (ref 65–99)

## 2016-04-24 ENCOUNTER — Encounter (HOSPITAL_COMMUNITY)
Admission: RE | Admit: 2016-04-24 | Discharge: 2016-04-24 | Disposition: A | Discharge status: Home / Self Care | Payer: BLUE CROSS/BLUE SHIELD | Source: Ambulatory Visit | Attending: Internal Medicine | Admitting: Internal Medicine

## 2016-04-24 DIAGNOSIS — Z951 Presence of aortocoronary bypass graft: Secondary | ICD-10-CM

## 2016-04-24 LAB — GLUCOSE, CAPILLARY
GLUCOSE-CAPILLARY: 121 mg/dL — AB (ref 65–99)
Glucose-Capillary: 77 mg/dL (ref 65–99)

## 2016-04-24 NOTE — Progress Notes (Signed)
Daily Session Note  Patient Details  Name: Gavin Lawson MRN: [id number] Date of Birth: 11/29/1951 Referring Provider:        CARDIAC REHAB PHASE II ORIENTATION from 04/04/2016 in [facility]   Referring Provider  Kutcher, Michael MD      Encounter Date: 04/24/2016  Check In:     Session Check In - 04/24/16 1016    Check-In   Location [facility]   Staff Present Carlette Carlton, RN, BSN;Jessica Hawkins, MA, ACSM RCEP, Exercise Physiologist;Joann Rion, RN, BSN;Lisa Hughes, RN   Supervising physician immediately available to respond to emergencies [facility] immediately available   Physician(s) Dr. Merrell   Medication changes reported     No   Fall or balance concerns reported    No   Warm-up and Cool-down Performed as group-led instruction   Resistance Training Performed No   VAD Patient? No   Pain Assessment   Currently in Pain? No/denies      Capillary Blood Glucose: No results found for this or any previous visit (from the past 24 hour(s)).   Goals Met:  Exercise tolerated well  Goals Unmet:  Not Applicable  Comments: Pt arrived to exercise for the 9:45 phase II class. Pt pre exercise blood glucose 179.  Pt given additional graham crackers to eat prior to exercise. Post exercise blood glucose 77. Pt asymptomatic with no complaints. Pt given banana and lemonade/gingerale to drink.  Rechecked blood glucose after 15 minutes blood glucose was 122.  Pt advised to eat a meal with protein.  Pt verbalized understanding.  Talked with with pt about pre exercise foods to have to avoid drop in blood glucose. Pt will be absent on Friday due to echo appt at [facility]. Carlette Carlton RN, BSN   Dr. Traci Turner is Medical Director for Cardiac Rehab at [facility]. 

## 2016-04-26 ENCOUNTER — Encounter (HOSPITAL_COMMUNITY): Payer: BLUE CROSS/BLUE SHIELD

## 2016-04-29 ENCOUNTER — Encounter (HOSPITAL_COMMUNITY)
Admission: RE | Admit: 2016-04-29 | Discharge: 2016-04-29 | Disposition: A | Discharge status: Home / Self Care | Payer: BLUE CROSS/BLUE SHIELD | Source: Ambulatory Visit | Attending: Internal Medicine | Admitting: Internal Medicine

## 2016-04-29 DIAGNOSIS — Z951 Presence of aortocoronary bypass graft: Secondary | ICD-10-CM | POA: Diagnosis not present

## 2016-04-29 LAB — GLUCOSE, CAPILLARY
GLUCOSE-CAPILLARY: 112 mg/dL — AB (ref 65–99)
Glucose-Capillary: 94 mg/dL (ref 65–99)

## 2016-04-29 NOTE — Progress Notes (Signed)
Nutrition Consult Spoke with RN and pt. Pt CBG's dropping significantly with exercise, which is desired. Pt is having to receive post-exercise snacks at rehab, which is not desirable. Pt states he eats a sausage, egg, and cheese sandwich (on a bun) from [employer] on his way to rehab Mon/Wed/Fri. Pre-exercise CBG's have been 189-247 mg/dL. Post-exercise CBG's have been 77-100 mg/dL. Per policy, CBG needs to be > 100 mg/dL before pt leaves rehab for pt safety. Discussed switching pt insulin regimen to help prevent CBG's from dropping as low with exercise. Pt states he told Dr. Reed Pandyamsey that he would "follow whatever Dr. Reed Pandyamsey said about his diabetes." Will ask Dr. Reed Pandyamsey re: changing 70/30 insulin to 23 units at breakfast and 27 units at dinner. Continue client-centered nutrition education by RD as part of interdisciplinary care.  Monitor and evaluate progress toward nutrition goal with team.  Mickle PlumbEdna Jamicah Anstead, M.Ed, RD, LDN, CDE 04/29/2016 11:32 AM

## 2016-05-01 ENCOUNTER — Encounter (HOSPITAL_COMMUNITY)
Admission: RE | Admit: 2016-05-01 | Discharge: 2016-05-01 | Disposition: A | Discharge status: Home / Self Care | Payer: BLUE CROSS/BLUE SHIELD | Source: Ambulatory Visit | Attending: Internal Medicine | Admitting: Internal Medicine

## 2016-05-01 DIAGNOSIS — Z951 Presence of aortocoronary bypass graft: Secondary | ICD-10-CM | POA: Diagnosis not present

## 2016-05-01 LAB — GLUCOSE, CAPILLARY
GLUCOSE-CAPILLARY: 152 mg/dL — AB (ref 65–99)
Glucose-Capillary: 113 mg/dL — ABNORMAL HIGH (ref 65–99)

## 2016-05-01 NOTE — Progress Notes (Signed)
Nutrition Note Spoke with Darden Amber, PharmD 04/30/16 re: recommendations. Pt met with Darden Amber, Pharm D yesterday. Insulin adjusted as previously suggested. Pt glucometer accuracy checked with a control solution. Pt glucometer reading 9 mg/dL lower than control solution. Pre-exercise CBG taken from the hospital glucometer was 152 mg/dL and pt's glucometer measured 179 mg/dL. Pt encouraged to either change the battery in his glucometer or buy another glucometer. Per discussion, pt likes to check CBG's 1 time a day "and more if my sugar feels off." Pt limits CBG checks due to cost of strips. Pt is using a [employer] glucometer and strips. Continue to monitor CBG's with team and pt. Will provide nutrition intervention prn. Derek Mound, M.Ed, RD, LDN, CDE 05/01/2016 10:14 AM

## 2016-05-03 ENCOUNTER — Encounter (HOSPITAL_COMMUNITY)
Admission: RE | Admit: 2016-05-03 | Discharge: 2016-05-03 | Disposition: A | Discharge status: Home / Self Care | Payer: BLUE CROSS/BLUE SHIELD | Source: Ambulatory Visit | Attending: Internal Medicine | Admitting: Internal Medicine

## 2016-05-03 DIAGNOSIS — Z951 Presence of aortocoronary bypass graft: Secondary | ICD-10-CM | POA: Diagnosis not present

## 2016-05-03 LAB — GLUCOSE, CAPILLARY
GLUCOSE-CAPILLARY: 132 mg/dL — AB (ref 65–99)
Glucose-Capillary: 115 mg/dL — ABNORMAL HIGH (ref 65–99)

## 2016-05-06 ENCOUNTER — Encounter (HOSPITAL_COMMUNITY): Payer: BLUE CROSS/BLUE SHIELD

## 2016-05-08 ENCOUNTER — Encounter (HOSPITAL_COMMUNITY): Payer: BLUE CROSS/BLUE SHIELD

## 2016-05-09 ENCOUNTER — Encounter (HOSPITAL_COMMUNITY): Payer: Self-pay | Admitting: *Deleted

## 2016-05-09 NOTE — Progress Notes (Signed)
Cardiac Individual Treatment Plan  Patient Details  Name: Gavin Lawson MRN: [id number] Date of Birth: 1951-09-15 Referring Provider:        CARDIAC REHAB PHASE II ORIENTATION from 04/04/2016 in [facility] CARDI[facility]   Referring Provider  Cloria Spring MD      Initial Encounter Date:       CARDIAC REHAB PHASE II ORIENTATION from 04/04/2016 in [facility]   Date  04/04/16   Referring Provider  Cloria Spring MD      Visit Diagnosis: No diagnosis found.  Patient's Home Medications on Admission:  Current outpatient prescriptions:  .  acetaminophen (TYLENOL) 500 MG tablet, Take 1,000 mg by mouth 3 (three) times daily. , Disp: , Rfl:  .  aspirin 81 MG chewable tablet, Chew 162 mg by mouth daily., Disp: , Rfl:  .  carvedilol (COREG) 12.5 MG tablet, Take 12.5 mg by mouth 2 (two) times daily with a meal., Disp: , Rfl:  .  furosemide (LASIX) 40 MG tablet, Take 1 tablet (40 mg total) by mouth daily., Disp: 30 tablet, Rfl: 0 .  insulin NPH-regular Human (NOVOLIN 70/30) (70-30) 100 UNIT/ML injection, Inject 20-27 Units into the skin 2 (two) times daily with a meal. 27 units with breakfast and 23 units with dinner, Disp: , Rfl:  .  lisinopril (PRINIVIL,ZESTRIL) 5 MG tablet, Take 5 mg by mouth 2 (two) times daily. , Disp: , Rfl:  .  oxyCODONE (OXY IR/ROXICODONE) 5 MG immediate release tablet, Take 5 mg by mouth 2 (two) times daily as needed for severe pain., Disp: , Rfl:  .  pantoprazole (PROTONIX) 40 MG tablet, Take 40 mg by mouth daily., Disp: , Rfl:  .  pravastatin (PRAVACHOL) 40 MG tablet, Take 40 mg by mouth daily., Disp: , Rfl:  .  sennosides-docusate sodium (SENOKOT-S) 8.6-50 MG tablet, Take 2 tablets by mouth at bedtime., Disp: , Rfl:  .  traMADol (ULTRAM) 50 MG tablet, Take 50 mg by mouth 2 (two) times daily as needed. Reported on 04/08/2016, Disp: , Rfl:   Past Medical History: Past Medical History  Diagnosis Date  . COPD (chronic  obstructive pulmonary disease) (HCC)   . Hypertension   . Shortness of breath dyspnea   . Diabetes mellitus     INSULIN DEPENDENT  . Depression   . GERD (gastroesophageal reflux disease)   . Headache   . Arthritis   . Sciatic nerve pain   . Back pain   . Coronary artery disease 01/02/16    CABGx3 LIMA    Tobacco Use: History  Smoking status  . Never Smoker   Smokeless tobacco  . Never Used    Comment: exposed to second hand smoke     Labs: Recent Review Flowsheet Data    Labs for ITP Cardiac and Pulmonary Rehab Latest Ref Rng 09/28/2015   Cholestrol 0 - 200 mg/dL 409(W)   LDLCALC 0 - 99 mg/dL 119(J)   HDL >47 mg/dL 52   Trlycerides <829 mg/dL 95   Hemoglobin F6O 4.8 - 5.6 % 9.5(H)      Capillary Blood Glucose: Lab Results  Component Value Date   GLUCAP 132* 05/03/2016   GLUCAP 115* 05/03/2016   GLUCAP 113* 05/01/2016   GLUCAP 152* 05/01/2016   GLUCAP 112* 04/29/2016     Exercise Target Goals:    Exercise Program Goal: Individual exercise prescription set with THRR, safety & activity barriers. Participant demonstrates ability to understand and report RPE using BORG  scale, to self-measure pulse accurately, and to acknowledge the importance of the exercise prescription.  Exercise Prescription Goal: Starting with aerobic activity 30 plus minutes a day, 3 days per week for initial exercise prescription. Provide home exercise prescription and guidelines that participant acknowledges understanding prior to discharge.  Activity Barriers & Risk Stratification:     Activity Barriers & Cardiac Risk Stratification - 04/04/16 0909    Activity Barriers & Cardiac Risk Stratification   Activity Barriers Arthritis;Back Problems;Deconditioning;Joint Problems;Other (comment)   Comments possible R/L hip surgery   Cardiac Risk Stratification High      6 Minute Walk:     6 Minute Walk      04/04/16 1351       6 Minute Walk   Phase Initial     Distance 600 feet      Walk Time 5.3 minutes     # of Rest Breaks 2  14 sec and 32 secs     MPH 1.2     METS 0.93     RPE 11     VO2 Peak 3.3     Symptoms Yes (comment)     Comments 8/10 back pain     Resting HR 62 bpm     Resting BP 142/72 mmHg     Max Ex. HR 81 bpm     Max Ex. BP 146/82 mmHg     2 Minute Post BP 132/82 mmHg        Initial Exercise Prescription:     Initial Exercise Prescription - 04/04/16 1300    Date of Initial Exercise RX and Referring Provider   Date 04/04/16   Referring Provider Cloria Spring MD   NuStep   Level --   Minutes --   METs --   T5 Nustep   Level 2   Minutes 30   METs 1.5   Prescription Details   Frequency (times per week) 3   Duration Progress to 30 minutes of continuous aerobic without signs/symptoms of physical distress   Intensity   THRR 40-80% of Max Heartrate 63-126   Ratings of Perceived Exertion 11-13   Progression   Progression Continue to progress workloads to maintain intensity without signs/symptoms of physical distress.   Resistance Training   Training Prescription Yes   Weight 1   Reps 10-12      Perform Capillary Blood Glucose checks as needed.  Exercise Prescription Changes:     Exercise Prescription Changes      04/08/16 1300 04/15/16 1100 04/16/16 0800 05/01/16 1300     Exercise Review   Progression No  Pt started exercise 4/24 No  Pt started exercise 4/24 Yes Yes    Response to Exercise   Blood Pressure (Admit) 134/72 mmHg 134/72 mmHg 142/70 mmHg 116/61 mmHg    Blood Pressure (Exercise) 148/70 mmHg 148/70 mmHg 128/70 mmHg 138/70 mmHg    Blood Pressure (Exit) 138/80 mmHg 138/80 mmHg 122/70 mmHg 112/60 mmHg    Heart Rate (Admit) 67 bpm 67 bpm 66 bpm 73 bpm    Heart Rate (Exercise) 71 bpm 71 bpm 82 bpm 98 bpm    Heart Rate (Exit) 55 bpm 55 bpm 61 bpm 73 bpm    Rating of Perceived Exertion (Exercise) 11 11 13 11     Symptoms Chronic hip/leg pain Chronic hip/leg pain Chronic hip/leg pain Chronic hip/leg pain    Comments   Home Exercise Given 04/15/16 Home Exercise Given 04/15/16 Home Exercise Given 04/15/16    Duration Progress to 30  minutes of continuous aerobic without signs/symptoms of physical distress Progress to 30 minutes of continuous aerobic without signs/symptoms of physical distress Progress to 30 minutes of continuous aerobic without signs/symptoms of physical distress Progress to 30 minutes of continuous aerobic without signs/symptoms of physical distress    Intensity THRR unchanged THRR unchanged THRR unchanged THRR unchanged    Progression   Progression Continue to progress workloads to maintain intensity without signs/symptoms of physical distress. Continue to progress workloads to maintain intensity without signs/symptoms of physical distress. Continue to progress workloads to maintain intensity without signs/symptoms of physical distress. Continue to progress workloads to maintain intensity without signs/symptoms of physical distress.    Average METs 1.8 1.8 1.8 2.4    Resistance Training   Training Prescription Yes Yes Yes Yes    Weight 1 1 2  lbs 2    Reps 10-12 10-12 10-12 10-12    Interval Training   Interval Training No No No No    T5 Nustep   Level 2 2 2 4     Minutes 20 20 30 30     METs 1.5 1.5 1.8 2.6    Home Exercise Plan   Plans to continue exercise at  Home  Walking and Chair Exercises Home  Walking and Chair Exercises Home    Frequency  Add 2 additional days to program exercise sessions. Add 2 additional days to program exercise sessions. Add 2 additional days to program exercise sessions.       Exercise Comments:     Exercise Comments      04/08/16 1354 04/16/16 0851 05/01/16 1350       Exercise Comments Pt started exercise today.  He is going to be limited by his right leg and hip.  Encouraged rest breaks to ease pain. Pt is tolerating his exercise despite his chronic pain.  We have discussed icing after exercise to help keep down inflammation. Pt is limited by chronic  hip/sciatic nerve pain but is no longer experiencing SOB.        Discharge Exercise Prescription (Final Exercise Prescription Changes):     Exercise Prescription Changes - 05/01/16 1300    Exercise Review   Progression Yes   Response to Exercise   Blood Pressure (Admit) 116/61 mmHg   Blood Pressure (Exercise) 138/70 mmHg   Blood Pressure (Exit) 112/60 mmHg   Heart Rate (Admit) 73 bpm   Heart Rate (Exercise) 98 bpm   Heart Rate (Exit) 73 bpm   Rating of Perceived Exertion (Exercise) 11   Symptoms Chronic hip/leg pain   Comments Home Exercise Given 04/15/16   Duration Progress to 30 minutes of continuous aerobic without signs/symptoms of physical distress   Intensity THRR unchanged   Progression   Progression Continue to progress workloads to maintain intensity without signs/symptoms of physical distress.   Average METs 2.4   Resistance Training   Training Prescription Yes   Weight 2   Reps 10-12   Interval Training   Interval Training No   T5 Nustep   Level 4   Minutes 30   METs 2.6   Home Exercise Plan   Plans to continue exercise at Home   Frequency Add 2 additional days to program exercise sessions.      Nutrition:  Target Goals: Understanding of nutrition guidelines, daily intake of sodium 1500mg , cholesterol 200mg , calories 30% from fat and 7% or less from saturated fats, daily to have 5 or more servings of fruits and vegetables.  Biometrics:     Pre  Biometrics - 04/04/16 1416    Pre Biometrics   Waist Circumference 54 inches   Hip Circumference 53 inches   Waist to Hip Ratio 1.02 %   Triceps Skinfold 34 mm   % Body Fat 44 %   Grip Strength 37 kg   Flexibility 0 in   Single Leg Stand 0 seconds       Nutrition Therapy Plan and Nutrition Goals:     Nutrition Therapy & Goals - 04/04/16 1025    Nutrition Therapy   Diet Diabetic, Therapeutic Lifestyle Changes   Personal Nutrition Goals   Personal Goal #1 Wt loss of 0.5-2 lb/week to a goal wt loss of  6-24 lb at graduation from Cardiac Rehab   Personal Goal #2 Improve blood glucose control as evidenced by a decrease in HbgA1c from 9.5 09/28/15 toward the desired goal of < 7.0   Intervention Plan   Intervention Prescribe, educate and counsel regarding individualized specific dietary modifications aiming towards targeted core components such as weight, hypertension, lipid management, diabetes, heart failure and other comorbidities.   Expected Outcomes Short Term Goal: Understand basic principles of dietary content, such as calories, fat, sodium, cholesterol and nutrients.;Long Term Goal: Adherence to prescribed nutrition plan.      Nutrition Discharge: Nutrition Scores:     Nutrition Assessments - 04/10/16 1043    MEDFICTS Scores   Pre Score 63  will verify score with pt      Nutrition Goals Re-Evaluation:   Psychosocial: Target Goals: Acknowledge presence or absence of depression, maximize coping skills, provide positive support system. Participant is able to verbalize types and ability to use techniques and skills needed for reducing stress and depression.  Initial Review & Psychosocial Screening:     Initial Psych Review & Screening - 04/10/16 1016    Family Dynamics   Good Support System? No   Strains Intra-family strains   Comments Patient says he has some financial and family issues he did not want to elaborate on.      Quality of Life Scores:     Quality of Life - 04/11/16 1312    Quality of Life Scores   Health/Function Pre --  Patient does not feel good about his health due to being overwieight and his CAD.   Socioeconomic Pre --  Patient lives with a friend but does not have a lot of friends outside his home   Psych/Spiritual Pre --  Patient has a strong belief in God   GLOBAL Pre --  Will forward quality of life to patients cardiologist      PHQ-9:     Recent Review Flowsheet Data    Depression screen [facility] 2/9 04/08/2016   Decreased Interest 1    Down, Depressed, Hopeless 1   PHQ - 2 Score 2   Altered sleeping 0   Tired, decreased energy 0   Change in appetite 0   Feeling bad or failure about yourself  1   Trouble concentrating 1    Moving slowly or fidgety/restless 0   Suicidal thoughts 0   PHQ-9 Score 4   Difficult doing work/chores Somewhat difficult      Psychosocial Evaluation and Intervention:   Psychosocial Re-Evaluation:     Psychosocial Re-Evaluation      05/09/16 1236           Psychosocial Re-Evaluation   Interventions Stress management education;Encouraged to attend Cardiac Rehabilitation for the exercise       Continued Psychosocial Services Needed No  Vocational Rehabilitation: Provide vocational rehab assistance to qualifying candidates.   Vocational Rehab Evaluation & Intervention:     Vocational Rehab - 04/04/16 1623    Initial Vocational Rehab Evaluation & Intervention   Assessment shows need for Vocational Rehabilitation No      Education: Education Goals: Education classes will be provided on a weekly basis, covering required topics. Participant will state understanding/return demonstration of topics presented.  Learning Barriers/Preferences:     Learning Barriers/Preferences - 04/04/16 1231    Learning Barriers/Preferences   Learning Preferences Individual Instruction;Verbal Instruction;Video;Written Material      Education Topics: Count Your Pulse:  -Group instruction provided by verbal instruction, demonstration, patient participation and written materials to support subject.  Instructors address importance of being able to find your pulse and how to count your pulse when at home without a heart monitor.  Patients get hands on experience counting their pulse with staff help and individually.          CARDIAC REHAB PHASE II EXERCISE from 05/01/2016 in [facility] CARDIAC REHAB   Date  04/19/16   Educator  Deveron Furlong   Instruction Review Code  2-  meets goals/outcomes      Heart Attack, Angina, and Risk Factor Modification:  -Group instruction provided by verbal instruction, video, and written materials to support subject.  Instructors address signs and symptoms of angina and heart attacks.    Also discuss risk factors for heart disease and how to make changes to improve heart health risk factors.      CARDIAC REHAB PHASE II EXERCISE from 05/01/2016 in [facility] CARDIAC REHAB   Date  04/17/16   Instruction Review Code  2- meets goals/outcomes      Functional Fitness:  -Group instruction provided by verbal instruction, demonstration, patient participation, and written materials to support subject.  Instructors address safety measures for doing things around the house.  Discuss how to get up and down off the floor, how to pick things up properly, how to safely get out of a chair without assistance, and balance training.   Meditation and Mindfulness:  -Group instruction provided by verbal instruction, patient participation, and written materials to support subject.  Instructor addresses importance of mindfulness and meditation practice to help reduce stress and improve awareness.  Instructor also leads participants through a meditation exercise.    Stretching for Flexibility and Mobility:  -Group instruction provided by verbal instruction, patient participation, and written materials to support subject.  Instructors lead participants through series of stretches that are designed to increase flexibility thus improving mobility.  These stretches are additional exercise for major muscle groups that are typically performed during regular warm up and cool down.   Hands Only CPR Anytime:  -Group instruction provided by verbal instruction, video, patient participation and written materials to support subject.  Instructors co-teach with AHA video for hands only CPR.  Participants get hands on experience with  mannequins.   Nutrition I class: Heart Healthy Eating:  -Group instruction provided by PowerPoint slides, verbal discussion, and written materials to support subject matter. The instructor gives an explanation and review of the Therapeutic Lifestyle Changes diet recommendations, which includes a discussion on lipid goals, dietary fat, sodium, fiber, plant stanol/sterol esters, sugar, and the components of a well-balanced, healthy diet.      CARDIAC REHAB PHASE II EXERCISE from 05/01/2016 in [facility] CARDIAC REHAB   Date  04/15/16   Educator  RD   Instruction Review  Code  Not applicable [class handout given]      Nutrition II class: Lifestyle Skills:  -Group instruction provided by PowerPoint slides, verbal discussion, and written materials to support subject matter. The instructor gives an explanation and review of label reading, grocery shopping for heart health, heart healthy recipe modifications, and ways to make healthier choices when eating out.      CARDIAC REHAB PHASE II EXERCISE from 05/01/2016 in [facility] CARDIAC REHAB   Date  04/15/16   Educator  RD   Instruction Review Code  Not applicable [class handouts given]      Diabetes Question & Answer:  -Group instruction provided by PowerPoint slides, verbal discussion, and written materials to support subject matter. The instructor gives an explanation and review of diabetes co-morbidities, pre- and post-prandial blood glucose goals, pre-exercise blood glucose goals, signs, symptoms, and treatment of hypoglycemia and hyperglycemia, and foot care basics.   Diabetes Blitz:  -Group instruction provided by PowerPoint slides, verbal discussion, and written materials to support subject matter. The instructor gives an explanation and review of the physiology behind type 1 and type 2 diabetes, diabetes medications and rational behind using different medications, pre- and post-prandial blood glucose  recommendations and Hemoglobin A1c goals, diabetes diet, and exercise including blood glucose guidelines for exercising safely.       CARDIAC REHAB PH[facility]   Date  04/15/16   Educator  RD   Instruction Review Code  Not applicable [class handouts given]      Portion Distortion:  -Group instruction provided by PowerPoint slides, verbal discussion, written materials, and food models to support subject matter. The instructor gives an explanation of serving size versus portion size, changes in portions sizes over the last 20 years, and what consists of a serving from each food group.   Stress Management:  -Group instruction provided by verbal instruction, video, and written materials to support subject matter.  Instructors review role of stress in heart disease and how to cope with stress positively.     Exercising on Your Own:  -Group instruction provided by verbal instruction, power point, and written materials to support subject.  Instructors discuss benefits of exercise, components of exercise, frequency and intensity of exercise, and end points for exercise.  Also discuss use of nitroglycerin and activating EMS.  Review options of places to exercise outside of rehab.  Review guidelines for sex with heart disease.   Cardiac Drugs I:  -Group instruction provided by verbal instruction and written materials to support subject.  Instructor reviews cardiac drug classes: antiplatelets, anticoagulants, beta blockers, and statins.  Instructor discusses reasons, side effects, and lifestyle considerations for each drug class.   Cardiac Drugs II:  -Group instruction provided by verbal instruction and written materials to support subject.  Instructor reviews cardiac drug classes: angiotensin converting enzyme inhibitors (ACE-I), angiotensin II receptor blockers (ARBs), nitrates, and calcium channel blockers.  Instructor discusses  reasons, side effects, and lifestyle considerations for each drug class.   Anatomy and Physiology of the Circulatory System:  -Group instruction provided by verbal instruction, video, and written materials to support subject.  Reviews functional anatomy of heart, how it relates to various diagnoses, and what role the heart plays in the overall system.      CARDIAC REHAB [facility]   Date  05/01/16   Instruction Review Code  2- meets goals/outcomes  Knowledge Questionnaire Score:     Knowledge Questionnaire Score - 04/10/16 1044    Knowledge Questionnaire Score   Pre Score 21/24               DM 13/15      Core Components/Risk Factors/Patient Goals at Admission:     Personal Goals and Risk Factors at Admission - 04/04/16 0913    Core Components/Risk Factors/Patient Goals on Admission    Weight Management Yes;Obesity   Intervention Weight Management: Develop a combined nutrition and exercise program designed to reach desired caloric intake, while maintaining appropriate intake of nutrient and fiber, sodium and fats, and appropriate energy expenditure required for the weight goal.;Weight Management: Provide education and appropriate resources to help participant work on and attain dietary goals.;Obesity: Provide education and appropriate resources to help participant work on and attain dietary goals.   Admit Weight 300 lb 11.3 oz (136.4 kg)   Goal Weight: Short Term 295 lb (133.811 kg)   Goal Weight: Long Term 290 lb (131.543 kg)   Expected Outcomes Long Term: Adherence to nutrition and physical activity/exercise program aimed toward attainment of established weight goal;Short Term: Continue to assess and modify interventions until short term weight is achieved;Weight Loss: Understanding of general recommendations for a balanced deficit meal plan, which promotes 1-2 lb weight loss per week and includes a negative  energy balance of [phone] kcal/d;Understanding recommendations for meals to include 15-35% energy as protein, 25-35% energy from fat, 35-60% energy from carbohydrates, less than 200mg  of dietary cholesterol, 20-35 gm of total fiber daily;Understanding of distribution of calorie intake throughout the day with the consumption of 4-5 meals/snacks   Sedentary Yes   Intervention Provide advice, education, support and counseling about physical activity/exercise needs.;Develop an individualized exercise prescription for aerobic and resistive training based on initial evaluation findings, risk stratification, comorbidities and participant's personal goals.   Expected Outcomes Achievement of increased cardiorespiratory fitness and enhanced flexibility, muscular endurance and strength shown through measurements of functional capacity and personal statement of participant.   Increase Strength and Stamina Yes   Intervention Provide advice, education, support and counseling about physical activity/exercise needs.;Develop an individualized exercise prescription for aerobic and resistive training based on initial evaluation findings, risk stratification, comorbidities and participant's personal goals.   Expected Outcomes Achievement of increased cardiorespiratory fitness and enhanced flexibility, muscular endurance and strength shown through measurements of functional capacity and personal statement of participant.   Improve shortness of breath with ADL's Yes   Intervention Provide education, individualized exercise plan and daily activity instruction to help decrease symptoms of SOB with activities of daily living.   Expected Outcomes Short Term: Achieves a reduction of symptoms when performing activities of daily living.   Diabetes Yes   Intervention Provide education about signs/symptoms and action to take for hypo/hyperglycemia.;Provide education about proper nutrition, including hydration, and aerobic/resistive  exercise prescription along with prescribed medications to achieve blood glucose in normal ranges: Fasting glucose 65-99 mg/dL   Expected Outcomes Short Term: Participant verbalizes understanding of the signs/symptoms and immediate care of hyper/hypoglycemia, proper foot care and importance of medication, aerobic/resistive exercise and nutrition plan for blood glucose control.;Long Term: Attainment of HbA1C < 7%.   Hypertension Yes   Intervention Provide education on lifestyle modifcations including regular physical activity/exercise, weight management, moderate sodium restriction and increased consumption of fresh fruit, vegetables, and low fat dairy, alcohol moderation, and smoking cessation.;Monitor prescription use compliance.   Expected Outcomes Short Term: Continued assessment and intervention until BP is <  140/29mm HG in hypertensive participants. < 130/44mm HG in hypertensive participants with diabetes, heart failure or chronic kidney disease.;Long Term: Maintenance of blood pressure at goal levels.   Lipids Yes   Intervention Provide education and support for participant on nutrition & aerobic/resistive exercise along with prescribed medications to achieve LDL 70mg , HDL >40mg .   Expected Outcomes Long Term: Cholesterol controlled with medications as prescribed, with individualized exercise RX and with personalized nutrition plan. Value goals: LDL < , HDL > 40 mg.;Short Term: Participant states understanding of desired cholesterol values and is compliant with medications prescribed. Participant is following exercise prescription and nutrition guidelines.   Stress Yes   Intervention Offer individual and/or small group education and counseling on adjustment to heart disease, stress management and health-related lifestyle change. Teach and support self-help strategies.   Expected Outcomes Short Term: Participant demonstrates changes in health-related behavior, relaxation and other stress  management skills, ability to obtain effective social support, and compliance with psychotropic medications if prescribed.;Long Term: Emotional wellbeing is indicated by absence of clinically significant psychosocial distress or social isolation.   Personal Goal Other Yes   Personal Goal Get better- be able to walk longer with less SOB   Intervention Provide exercise prescription in program and at home toincrease exercise strength and endurance   Expected Outcomes increased exercise tolerance, decreased pain in hip with increased movement       Core Components/Risk Factors/Patient Goals Review:      Goals and Risk Factor Review      04/15/16 1136 05/01/16 1428         Core Components/Risk Factors/Patient Goals Review   Personal Goals Review Weight Management/Obesity;Diabetes Weight Management/Obesity;Diabetes;Other      Review see 04/15/16 RD note. Pt in the contemplative state of change re: wt loss and the active state of change re: DM control see 04/15/16 RD note. Pt in the contemplative state of change re: wt loss and the active state of change re: DM control      Expected Outcomes 0.5-2 lb wt loss/week and improved CBG control as evidenced by A1c decreasing toward goal of less than 7. 0.5-2 lb wt loss/week and improved CBG control as evidenced by A1c decreasing toward goal of less than 7.         Core Components/Risk Factors/Patient Goals at Discharge (Final Review):      Goals and Risk Factor Review - 05/01/16 1428    Core Components/Risk Factors/Patient Goals Review   Personal Goals Review Weight Management/Obesity;Diabetes;Other   Review see 04/15/16 RD note. Pt in the contemplative state of change re: wt loss and the active state of change re: DM control   Expected Outcomes 0.5-2 lb wt loss/week and improved CBG control as evidenced by A1c decreasing toward goal of less than 7.      ITP Comments:     ITP Comments      04/02/16 1017           ITP Comments Dr. Armanda Magic,  Medical Director          Comments: Pt is making expected progress toward personal goals after completing 12 sessions. Recommend continued exercise and life style modification education including  stress management and relaxation techniques to decrease cardiac risk profile. Ron is enjoying participating in cardiac rehab and says he feels stronger.

## 2016-05-10 ENCOUNTER — Encounter (HOSPITAL_COMMUNITY): Admission: RE | Admit: 2016-05-10 | Payer: BLUE CROSS/BLUE SHIELD | Source: Ambulatory Visit

## 2016-05-15 ENCOUNTER — Encounter (HOSPITAL_COMMUNITY): Payer: BLUE CROSS/BLUE SHIELD

## 2016-05-17 ENCOUNTER — Encounter (HOSPITAL_COMMUNITY)
Admission: RE | Admit: 2016-05-17 | Discharge: 2016-05-17 | Disposition: A | Discharge status: Home / Self Care | Payer: BLUE CROSS/BLUE SHIELD | Source: Ambulatory Visit | Attending: Internal Medicine | Admitting: Internal Medicine

## 2016-05-17 ENCOUNTER — Telehealth (HOSPITAL_COMMUNITY): Payer: Self-pay | Admitting: *Deleted

## 2016-05-17 DIAGNOSIS — E119 Type 2 diabetes mellitus without complications: Secondary | ICD-10-CM | POA: Insufficient documentation

## 2016-05-17 DIAGNOSIS — I10 Essential (primary) hypertension: Secondary | ICD-10-CM | POA: Diagnosis not present

## 2016-05-17 DIAGNOSIS — I2510 Atherosclerotic heart disease of native coronary artery without angina pectoris: Secondary | ICD-10-CM | POA: Diagnosis not present

## 2016-05-17 DIAGNOSIS — Z7982 Long term (current) use of aspirin: Secondary | ICD-10-CM | POA: Insufficient documentation

## 2016-05-17 DIAGNOSIS — Z794 Long term (current) use of insulin: Secondary | ICD-10-CM | POA: Diagnosis not present

## 2016-05-17 DIAGNOSIS — K219 Gastro-esophageal reflux disease without esophagitis: Secondary | ICD-10-CM | POA: Insufficient documentation

## 2016-05-17 DIAGNOSIS — J449 Chronic obstructive pulmonary disease, unspecified: Secondary | ICD-10-CM | POA: Insufficient documentation

## 2016-05-17 DIAGNOSIS — Z79899 Other long term (current) drug therapy: Secondary | ICD-10-CM | POA: Insufficient documentation

## 2016-05-17 DIAGNOSIS — Z951 Presence of aortocoronary bypass graft: Secondary | ICD-10-CM | POA: Insufficient documentation

## 2016-05-17 LAB — GLUCOSE, CAPILLARY
Glucose-Capillary: 182 mg/dL — ABNORMAL HIGH (ref 65–99)
Glucose-Capillary: 118 mg/dL — ABNORMAL HIGH (ref 65–99)

## 2016-05-20 ENCOUNTER — Encounter (HOSPITAL_COMMUNITY): Payer: BLUE CROSS/BLUE SHIELD

## 2016-05-22 ENCOUNTER — Encounter (HOSPITAL_COMMUNITY)
Admission: RE | Admit: 2016-05-22 | Discharge: 2016-05-22 | Disposition: A | Discharge status: Home / Self Care | Payer: BLUE CROSS/BLUE SHIELD | Source: Ambulatory Visit | Attending: Internal Medicine | Admitting: Internal Medicine

## 2016-05-22 DIAGNOSIS — Z951 Presence of aortocoronary bypass graft: Secondary | ICD-10-CM

## 2016-05-22 LAB — GLUCOSE, CAPILLARY
GLUCOSE-CAPILLARY: 94 mg/dL (ref 65–99)
Glucose-Capillary: 131 mg/dL — ABNORMAL HIGH (ref 65–99)

## 2016-05-24 ENCOUNTER — Encounter (HOSPITAL_COMMUNITY)
Admission: RE | Admit: 2016-05-24 | Discharge: 2016-05-24 | Disposition: A | Discharge status: Home / Self Care | Payer: BLUE CROSS/BLUE SHIELD | Source: Ambulatory Visit | Attending: Internal Medicine | Admitting: Internal Medicine

## 2016-05-24 DIAGNOSIS — Z951 Presence of aortocoronary bypass graft: Secondary | ICD-10-CM

## 2016-05-24 LAB — GLUCOSE, CAPILLARY
GLUCOSE-CAPILLARY: 134 mg/dL — AB (ref 65–99)
Glucose-Capillary: 97 mg/dL (ref 65–99)

## 2016-05-24 NOTE — Progress Notes (Signed)
Incomplete Session Note  Patient Details  Name: Gavin Lawson MRN: [id number] Date of Birth: Feb 06, 1951 Referring Provider:        [facility]ARD[facility]   Referring Provider  Cloria SpringKutcher, Michael MD      Gavin Altoonald Depaula did not complete his rehab session.  Pre exercise CBG 97. Patient given a banana and a lemonade. Recheck CBG 134. Gavin Lawson talked with the dietitian. Gavin Lawson is actually supposed to be taking 22 units in the morning of 70/30 and 26 units of 70/30 at night. Gavin Lawson [facility] and misunderstood his medication change. Insulin dose updated in epic. Gavin Lawson did not exercise today but plans to return to exercise on Monday.

## 2016-05-24 NOTE — Progress Notes (Signed)
Nutrition Note Spoke with pt. Pt reports Novolin 70/30 changed to 24 units at breakfast and 26 units at night. Dr. Karie MainlandAli and Dr. Threasa Alphaamsey's notes reviewed and both notes instructed pt to take 22 units of insulin at breakfast and 26 units at night. Pt informed of discrepancy. Pt stated "I just thought I was still supposed to get a total of 50 units a day." Pt plans on correcting his error. Pt's pre-exercise CBG this am was 97 mg/dL after pt ate a breakfast platter at [employer], which consisted of eggs, sausage gravy, sausage, and a biscuit. Pt ate a banana and drank a lemonade and CBG increased to 134 mg/dL. Noted Dr. Ma HillockAli's recommendation for Metformin. Spoke with pt re: his resistance to restarting Metformin. Pt stated "I can't remember why but I think it didn't make me feel good." Pt denies GI distress when he took Metformin previously. Pt did c/o "I feel like I take a pharmacy of medications already." The benefit of resuming  Metformin and Metformin's action in the body discussed. Pt expressed understanding of the information discussed via feed-back method. Continue client-centered nutrition education by RD as part of interdisciplinary care.  Monitor and evaluate progress toward nutrition goal with team.  Mickle PlumbEdna Keelie Zemanek, M.Ed, RD, LDN, CDE 05/24/2016 10:41 AM

## 2016-05-27 ENCOUNTER — Encounter (HOSPITAL_COMMUNITY)
Admission: RE | Admit: 2016-05-27 | Discharge: 2016-05-27 | Disposition: A | Discharge status: Home / Self Care | Payer: BLUE CROSS/BLUE SHIELD | Source: Ambulatory Visit | Attending: Internal Medicine | Admitting: Internal Medicine

## 2016-05-27 DIAGNOSIS — Z951 Presence of aortocoronary bypass graft: Secondary | ICD-10-CM | POA: Diagnosis not present

## 2016-05-27 LAB — GLUCOSE, CAPILLARY
GLUCOSE-CAPILLARY: 124 mg/dL — AB (ref 65–99)
Glucose-Capillary: 62 mg/dL — ABNORMAL LOW (ref 65–99)

## 2016-05-27 NOTE — Progress Notes (Addendum)
Nutrition Note  Spoke with pt. Fasting CBG this am was 152 mg/dL. Pt ate his usual sausage, egg, and cheese sandwich on a hamburger bun from [employer] on the way to rehab aroun 9:15 am. Pre-exercise CBG 62 mg/dL. Pt states he feels his CBG's are dropping in the am at work too. Pt encouraged to try 20 units of 70/30 tomorrow am and Wednesday am to see if am CBG's improve with slight decrease in am insulin. Will notify Dr. Reed Pandyamsey and Dr. Karie MainlandAli re: trial of decreased am insulin and hypoglycemic episode. Continue client-centered nutrition education by RD as part of interdisciplinary care.  Monitor and evaluate progress toward nutrition goal with team.  Addendum: Pt called this writer and stated "I think I figured out what's happening." Per discussion, pt takes his bedtime 70/30 insulin between 10 pm - 12 am and pt takes his am insulin in the morning between 7:30 am- 9 am. Pt plans on trying to take his insulin 12 hrs apart "or as close as I can." This writer instructed pt to continue using 22 units of 70/30 in the am and focus on making sure injections are 12 hours apart. If hypoglycemia continues after 12 hours between injections, will evaluate whether or not the pt needs to decrease am dose. Pt expressed understanding of the information discussed via feedback method.  Mickle PlumbEdna Gradie Butrick, M.Ed, RD, LDN, CDE 05/27/2016 11:22 AM

## 2016-05-27 NOTE — Progress Notes (Signed)
Incomplete Session Note  Patient Details  Name: Gavin Lawson MRN: [id number] Date of Birth: [birth year] Referring Provider:        CARDIAC [facility]AB PHASE [facility]   Referring Provider  Cloria SpringKutcher, Michael MD      Gavin Lawson did not complete his rehab session.  Low blood glucose.  Pt pre exercise blood glucose was 62. Pt had sausage and egg on a hamburger bun for breakfast and water. Pt home blood glucose was 1524 prior to breakfast.  Pt given lemonade and banana to eat.  Diabetes educator/ RD, Marily MemosEdna, talked with pt about insulin dose.  Marily Memosdna to contact the Dr. Rico Alallie in regards of decreasing insulin dose on exercise days. Pt rechecked after 25 minutes 124. Pt understands that he will need to eat a meal that has protein to keep his blood glucose stable.  Pt to return to exercise on Wednesday. Alanson Alyarlette Melvenia Favela RN, BSN

## 2016-05-29 ENCOUNTER — Encounter (HOSPITAL_COMMUNITY)
Admission: RE | Admit: 2016-05-29 | Discharge: 2016-05-29 | Disposition: A | Discharge status: Home / Self Care | Payer: BLUE CROSS/BLUE SHIELD | Source: Ambulatory Visit | Attending: Internal Medicine | Admitting: Internal Medicine

## 2016-05-29 DIAGNOSIS — Z951 Presence of aortocoronary bypass graft: Secondary | ICD-10-CM

## 2016-05-29 LAB — GLUCOSE, CAPILLARY
GLUCOSE-CAPILLARY: 165 mg/dL — AB (ref 65–99)
Glucose-Capillary: 183 mg/dL — ABNORMAL HIGH (ref 65–99)

## 2016-05-31 ENCOUNTER — Encounter (HOSPITAL_COMMUNITY)
Admission: RE | Admit: 2016-05-31 | Discharge: 2016-05-31 | Disposition: A | Discharge status: Home / Self Care | Payer: BLUE CROSS/BLUE SHIELD | Source: Ambulatory Visit | Attending: Internal Medicine | Admitting: Internal Medicine

## 2016-05-31 DIAGNOSIS — Z951 Presence of aortocoronary bypass graft: Secondary | ICD-10-CM

## 2016-05-31 LAB — GLUCOSE, CAPILLARY
Glucose-Capillary: 160 mg/dL — ABNORMAL HIGH (ref 65–99)
Glucose-Capillary: 153 mg/dL — ABNORMAL HIGH (ref 65–99)

## 2016-06-03 ENCOUNTER — Encounter (HOSPITAL_COMMUNITY)
Admission: RE | Admit: 2016-06-03 | Discharge: 2016-06-03 | Disposition: A | Discharge status: Home / Self Care | Payer: BLUE CROSS/BLUE SHIELD | Source: Ambulatory Visit | Attending: Internal Medicine | Admitting: Internal Medicine

## 2016-06-03 DIAGNOSIS — Z951 Presence of aortocoronary bypass graft: Secondary | ICD-10-CM | POA: Diagnosis not present

## 2016-06-03 LAB — GLUCOSE, CAPILLARY
GLUCOSE-CAPILLARY: 206 mg/dL — AB (ref 65–99)
Glucose-Capillary: 156 mg/dL — ABNORMAL HIGH (ref 65–99)

## 2016-06-05 ENCOUNTER — Encounter (HOSPITAL_COMMUNITY)
Admission: RE | Admit: 2016-06-05 | Discharge: 2016-06-05 | Disposition: A | Discharge status: Home / Self Care | Payer: BLUE CROSS/BLUE SHIELD | Source: Ambulatory Visit | Attending: Internal Medicine | Admitting: Internal Medicine

## 2016-06-05 DIAGNOSIS — Z951 Presence of aortocoronary bypass graft: Secondary | ICD-10-CM | POA: Diagnosis not present

## 2016-06-05 LAB — GLUCOSE, CAPILLARY: GLUCOSE-CAPILLARY: 158 mg/dL — AB (ref 65–99)

## 2016-06-06 LAB — GLUCOSE, CAPILLARY: Glucose-Capillary: 208 mg/dL — ABNORMAL HIGH (ref 65–99)

## 2016-06-06 NOTE — Progress Notes (Signed)
Cardiac Individual Treatment Plan  Patient Details  Name: Gavin Lawson MRN: [id number] Date of Birth: 10/19/1951 Referring Provider:        CARDIAC REHAB PHASE II ORIENTATION from 04/04/2016 in [facility]   Referring Provider  Cloria Spring MD      Initial Encounter Date:       CARDIAC REHAB PHASE II ORIENTATION from 04/04/2016 in [facility] CARDIAC REHAB   Date  04/04/16   Referring Provider  Cloria Spring MD      Visit Diagnosis: S/P CABG x 3  Patient's Home Medications on Admission:  Current outpatient prescriptions:  .  acetaminophen (TYLENOL) 500 MG tablet, Take 1,000 mg by mouth 3 (three) times daily. , Disp: , Rfl:  .  aspirin 81 MG chewable tablet, Chew 162 mg by mouth daily., Disp: , Rfl:  .  carvedilol (COREG) 12.5 MG tablet, Take 12.5 mg by mouth 2 (two) times daily with a meal., Disp: , Rfl:  .  furosemide (LASIX) 40 MG tablet, Take 1 tablet (40 mg total) by mouth daily., Disp: 30 tablet, Rfl: 0 .  insulin NPH-regular Human (NOVOLIN 70/30) (70-30) 100 UNIT/ML injection, Inject 20-27 Units into the skin 2 (two) times daily with a meal. 22 units with breakfast and 26 units with dinner, Disp: , Rfl:  .  lisinopril (PRINIVIL,ZESTRIL) 5 MG tablet, Take 5 mg by mouth 2 (two) times daily. , Disp: , Rfl:  .  oxyCODONE (OXY IR/ROXICODONE) 5 MG immediate release tablet, Take 5 mg by mouth 2 (two) times daily as needed for severe pain., Disp: , Rfl:  .  pantoprazole (PROTONIX) 40 MG tablet, Take 40 mg by mouth daily., Disp: , Rfl:  .  pravastatin (PRAVACHOL) 40 MG tablet, Take 40 mg by mouth daily., Disp: , Rfl:  .  sennosides-docusate sodium (SENOKOT-S) 8.6-50 MG tablet, Take 2 tablets by mouth at bedtime., Disp: , Rfl:  .  traMADol (ULTRAM) 50 MG tablet, Take 50 mg by mouth 2 (two) times daily as needed. Reported on 04/08/2016, Disp: , Rfl:   Past Medical History: Past Medical History  Diagnosis Date  . COPD (chronic  obstructive pulmonary disease) (HCC)   . Hypertension   . Shortness of breath dyspnea   . Diabetes mellitus     INSULIN DEPENDENT  . Depression   . GERD (gastroesophageal reflux disease)   . Headache   . Arthritis   . Sciatic nerve pain   . Back pain   . Coronary artery disease 01/02/16    CABGx3 LIMA    Tobacco Use: History  Smoking status  . Never Smoker   Smokeless tobacco  . Never Used    Comment: exposed to second hand smoke     Labs: Recent Review Flowsheet Data    Labs for ITP Cardiac and Pulmonary Rehab Latest Ref Rng 09/28/2015   Cholestrol 0 - 200 mg/dL 409(W)   LDLCALC 0 - 99 mg/dL 119(J)   HDL >47 mg/dL 52   Trlycerides <829 mg/dL 95   Hemoglobin F6O 4.8 - 5.6 % 9.5(H)      Capillary Blood Glucose: Lab Results  Component Value Date   GLUCAP 158* 06/05/2016   GLUCAP 208* 06/05/2016   GLUCAP 156* 06/03/2016   GLUCAP 206* 06/03/2016   GLUCAP 153* 05/31/2016     Exercise Target Goals:    Exercise Program Goal: Individual exercise prescription set with THRR, safety & activity barriers. Participant demonstrates ability to understand and report RPE using  BORG scale, to self-measure pulse accurately, and to acknowledge the importance of the exercise prescription.  Exercise Prescription Goal: Starting with aerobic activity 30 plus minutes a day, 3 days per week for initial exercise prescription. Provide home exercise prescription and guidelines that participant acknowledges understanding prior to discharge.  Activity Barriers & Risk Stratification:     Activity Barriers & Cardiac Risk Stratification - 04/04/16 0909    Activity Barriers & Cardiac Risk Stratification   Activity Barriers Arthritis;Back Problems;Deconditioning;Joint Problems;Other (comment)   Comments possible R/L hip surgery   Cardiac Risk Stratification High      6 Minute Walk:     6 Minute Walk      04/04/16 1351       6 Minute Walk   Phase Initial     Distance 600 feet      Walk Time 5.3 minutes     # of Rest Breaks 2  14 sec and 32 secs     MPH 1.2     METS 0.93     RPE 11     VO2 Peak 3.3     Symptoms Yes (comment)     Comments 8/10 back pain     Resting HR 62 bpm     Resting BP 142/72 mmHg     Max Ex. HR 81 bpm     Max Ex. BP 146/82 mmHg     2 Minute Post BP 132/82 mmHg        Initial Exercise Prescription:     Initial Exercise Prescription - 04/04/16 1300    Date of Initial Exercise RX and Referring Provider   Date 04/04/16   Referring Provider Cloria Spring MD   NuStep   Level --   Minutes --   METs --   T5 Nustep   Level 2   Minutes 30   METs 1.5   Prescription Details   Frequency (times per week) 3   Duration Progress to 30 minutes of continuous aerobic without signs/symptoms of physical distress   Intensity   THRR 40-80% of Max Heartrate 63-126   Ratings of Perceived Exertion 11-13   Progression   Progression Continue to progress workloads to maintain intensity without signs/symptoms of physical distress.   Resistance Training   Training Prescription Yes   Weight 1   Reps 10-12      Perform Capillary Blood Glucose checks as needed.  Exercise Prescription Changes:     Exercise Prescription Changes      04/08/16 1300 04/15/16 1100 04/16/16 0800 05/01/16 1300 05/17/16 1400   Exercise Review   Progression No  Pt started exercise 4/24 No  Pt started exercise 4/24 Yes Yes Yes   Response to Exercise   Blood Pressure (Admit) 134/72 mmHg 134/72 mmHg 142/70 mmHg 116/61 mmHg 114/70 mmHg   Blood Pressure (Exercise) 148/70 mmHg 148/70 mmHg 128/70 mmHg 138/70 mmHg 138/82 mmHg   Blood Pressure (Exit) 138/80 mmHg 138/80 mmHg 122/70 mmHg 112/60 mmHg 118/64 mmHg   Heart Rate (Admit) 67 bpm 67 bpm 66 bpm 73 bpm 71 bpm   Heart Rate (Exercise) 71 bpm 71 bpm 82 bpm 98 bpm 97 bpm   Heart Rate (Exit) 55 bpm 55 bpm 61 bpm 73 bpm 67 bpm   Rating of Perceived Exertion (Exercise) 11 11 13 11 12    Symptoms Chronic hip/leg pain Chronic  hip/leg pain Chronic hip/leg pain Chronic hip/leg pain Chronic hip/leg pain   Comments  Home Exercise Given 04/15/16 Home Exercise Given 04/15/16 Home Exercise  Given 04/15/16 Home Exercise Given 04/15/16   Duration Progress to 30 minutes of continuous aerobic without signs/symptoms of physical distress Progress to 30 minutes of continuous aerobic without signs/symptoms of physical distress Progress to 30 minutes of continuous aerobic without signs/symptoms of physical distress Progress to 30 minutes of continuous aerobic without signs/symptoms of physical distress Progress to 30 minutes of continuous aerobic without signs/symptoms of physical distress   Intensity THRR unchanged THRR unchanged THRR unchanged THRR unchanged THRR unchanged   Progression   Progression Continue to progress workloads to maintain intensity without signs/symptoms of physical distress. Continue to progress workloads to maintain intensity without signs/symptoms of physical distress. Continue to progress workloads to maintain intensity without signs/symptoms of physical distress. Continue to progress workloads to maintain intensity without signs/symptoms of physical distress. Continue to progress workloads to maintain intensity without signs/symptoms of physical distress.   Average METs 1.8 1.8 1.8 2.4 2.5   Resistance Training   Training Prescription Yes Yes Yes Yes Yes   Weight 1 1 2  lbs 2 2   Reps 10-12 10-12 10-12 10-12 10-12   Interval Training   Interval Training No No No No No   T5 Nustep   Level 2 2 2 4 4    Minutes 20 20 30 30 30    METs 1.5 1.5 1.8 2.6 2.6   Home Exercise Plan   Plans to continue exercise at  Home  Walking and Chair Exercises Home  Walking and Chair Exercises Home Home   Frequency  Add 2 additional days to program exercise sessions. Add 2 additional days to program exercise sessions. Add 2 additional days to program exercise sessions. Add 2 additional days to program exercise sessions.     05/27/16 1700            Response to Exercise   Blood Pressure (Admit) 104/70 mmHg       Blood Pressure (Exercise) 153/70 mmHg       Blood Pressure (Exit) 114/62 mmHg       Heart Rate (Admit) 72 bpm       Heart Rate (Exercise) 100 bpm       Heart Rate (Exit) 72 bpm       Rating of Perceived Exertion (Exercise) 111       Symptoms Low blood sugar today.        Comments Home Exercise Given 04/15/16       Duration Progress to 30 minutes of continuous aerobic without signs/symptoms of physical distress       Intensity THRR unchanged       Progression   Average METs 2.4       Resistance Training   Training Prescription Yes       Weight 2       Reps 10-12       Interval Training   Interval Training No       T5 Nustep   Level 4       Minutes 30       METs 2.4       Home Exercise Plan   Plans to continue exercise at Home       Frequency Add 2 additional days to program exercise sessions.          Exercise Comments:     Exercise Comments      04/08/16 1354 04/16/16 0851 05/01/16 1350 05/17/16 1417 05/27/16 1718   Exercise Comments Pt started exercise today.  He is going to be limited by his right  leg and hip.  Encouraged rest breaks to ease pain. Pt is tolerating his exercise despite his chronic pain.  We have discussed icing after exercise to help keep down inflammation. Pt is limited by chronic hip/sciatic nerve pain but is no longer experiencing SOB. Making slow, steady progress with exercise. Low blood suagr today. Progressing workloads as able.      Discharge Exercise Prescription (Final Exercise Prescription Changes):     Exercise Prescription Changes - 05/27/16 1700    Response to Exercise   Blood Pressure (Admit) 104/70 mmHg   Blood Pressure (Exercise) 153/70 mmHg   Blood Pressure (Exit) 114/62 mmHg   Heart Rate (Admit) 72 bpm   Heart Rate (Exercise) 100 bpm   Heart Rate (Exit) 72 bpm   Rating of Perceived Exertion (Exercise) 111   Symptoms Low blood sugar today.    Comments  Home Exercise Given 04/15/16   Duration Progress to 30 minutes of continuous aerobic without signs/symptoms of physical distress   Intensity THRR unchanged   Progression   Average METs 2.4   Resistance Training   Training Prescription Yes   Weight 2   Reps 10-12   Interval Training   Interval Training No   T5 Nustep   Level 4   Minutes 30   METs 2.4   Home Exercise Plan   Plans to continue exercise at Home   Frequency Add 2 additional days to program exercise sessions.      Nutrition:  Target Goals: Understanding of nutrition guidelines, daily intake of sodium 1500mg , cholesterol 200mg , calories 30% from fat and 7% or less from saturated fats, daily to have 5 or more servings of fruits and vegetables.  Biometrics:     Pre Biometrics - 04/04/16 1416    Pre Biometrics   Waist Circumference 54 inches   Hip Circumference 53 inches   Waist to Hip Ratio 1.02 %   Triceps Skinfold 34 mm   % Body Fat 44 %   Grip Strength 37 kg   Flexibility 0 in   Single Leg Stand 0 seconds       Nutrition Therapy Plan and Nutrition Goals:     Nutrition Therapy & Goals - 04/04/16 1025    Nutrition Therapy   Diet Diabetic, Therapeutic Lifestyle Changes   Personal Nutrition Goals   Personal Goal #1 Wt loss of 0.5-2 lb/week to a goal wt loss of 6-24 lb at graduation from Cardiac Rehab   Personal Goal #2 Improve blood glucose control as evidenced by a decrease in HbgA1c from 9.5 09/28/15 toward the desired goal of < 7.0   Intervention Plan   Intervention Prescribe, educate and counsel regarding individualized specific dietary modifications aiming towards targeted core components such as weight, hypertension, lipid management, diabetes, heart failure and other comorbidities.   Expected Outcomes Short Term Goal: Understand basic principles of dietary content, such as calories, fat, sodium, cholesterol and nutrients.;Long Term Goal: Adherence to prescribed nutrition plan.      Nutrition  Discharge: Nutrition Scores:     Nutrition Assessments - 04/10/16 1043    MEDFICTS Scores   Pre Score 63  will verify score with pt      Nutrition Goals Re-Evaluation:   Psychosocial: Target Goals: Acknowledge presence or absence of depression, maximize coping skills, provide positive support system. Participant is able to verbalize types and ability to use techniques and skills needed for reducing stress and depression.  Initial Review & Psychosocial Screening:     Initial Psych Review &  Screening - 04/10/16 1016    Family Dynamics   Good Support System? No   Strains Intra-family strains   Comments Patient says he has some financial and family issues he did not want to elaborate on.      Quality of Life Scores:     Quality of Life - 04/11/16 1312    Quality of Life Scores   Health/Function Pre --  Patient does not feel good about his health due to being overwieight and his CAD.   Socioeconomic Pre --  Patient lives with a friend but does not have a lot of friends outside his home   Psych/Spiritual Pre --  Patient has a strong belief in God   GLOBAL Pre --  Will forward quality of life to patients cardiologist      PHQ-9:     Recent Review Flowsheet Data    Depression screen [facility] 2/9 04/08/2016   Decreased Interest 1   Down, Depressed, Hopeless 1   PHQ - 2 Score 2   Altered sleeping 0   Tired, decreased energy 0   Change in appetite 0   Feeling bad or failure about yourself  1   Trouble concentrating 1    Moving slowly or fidgety/restless 0   Suicidal thoughts 0   PHQ-9 Score 4   Difficult doing work/chores Somewhat difficult      Psychosocial Evaluation and Intervention:   Psychosocial Re-Evaluation:     Psychosocial Re-Evaluation      05/09/16 1236 06/06/16 1758         Psychosocial Re-Evaluation   Interventions Stress management education;Encouraged to attend Cardiac Rehabilitation for the exercise Stress management education;Encouraged to  attend Cardiac Rehabilitation for the exercise      Continued Psychosocial Services Needed No No         Vocational Rehabilitation: Provide vocational rehab assistance to qualifying candidates.   Vocational Rehab Evaluation & Intervention:     Vocational Rehab - 04/04/16 1623    Initial Vocational Rehab Evaluation & Intervention   Assessment shows need for Vocational Rehabilitation No      Education: Education Goals: Education classes will be provided on a weekly basis, covering required topics. Participant will state understanding/return demonstration of topics presented.  Learning Barriers/Preferences:     Learning Barriers/Preferences - 04/04/16 1231    Learning Barriers/Preferences   Learning Preferences Individual Instruction;Verbal Instruction;Video;Written Material      Education Topics: Count Your Pulse:  -Group instruction provided by verbal instruction, demonstration, patient participation and written materials to support subject.  Instructors address importance of being able to find your pulse and how to count your pulse when at home without a heart monitor.  Patients get hands on experience counting their pulse with staff help and individually.          CARDIAC REHAB PHASE II EXERCISE from 05/22/2016 in [facility] CARDIAC REHAB   Date  04/19/16   Educator  Deveron Furlong   Instruction Review Code  2- meets goals/outcomes      Heart Attack, Angina, and Risk Factor Modification:  -Group instruction provided by verbal instruction, video, and written materials to support subject.  Instructors address signs and symptoms of angina and heart attacks.    Also discuss risk factors for heart disease and how to make changes to improve heart health risk factors.      CARDIAC REHAB PHASE II EXERCISE from 05/22/2016 in [facility] CARDIAC REHAB   Date  04/17/16  Instruction Review Code  2- meets goals/outcomes      Functional Fitness:   -Group instruction provided by verbal instruction, demonstration, patient participation, and written materials to support subject.  Instructors address safety measures for doing things around the house.  Discuss how to get up and down off the floor, how to pick things up properly, how to safely get out of a chair without assistance, and balance training.   Meditation and Mindfulness:  -Group instruction provided by verbal instruction, patient participation, and written materials to support subject.  Instructor addresses importance of mindfulness and meditation practice to help reduce stress and improve awareness.  Instructor also leads participants through a meditation exercise.    Stretching for Flexibility and Mobility:  -Group instruction provided by verbal instruction, patient participation, and written materials to support subject.  Instructors lead participants through series of stretches that are designed to increase flexibility thus improving mobility.  These stretches are additional exercise for major muscle groups that are typically performed during regular warm up and cool down.   Hands Only CPR Anytime:  -Group instruction provided by verbal instruction, video, patient participation and written materials to support subject.  Instructors co-teach with AHA video for hands only CPR.  Participants get hands on experience with mannequins.   Nutrition I class: Heart Healthy Eating:  -Group instruction provided by PowerPoint slides, verbal discussion, and written materials to support subject matter. The instructor gives an explanation and review of the Therapeutic Lifestyle Changes diet recommendations, which includes a discussion on lipid goals, dietary fat, sodium, fiber, plant stanol/sterol esters, sugar, and the components of a well-balanced, healthy diet.      CARDIAC REHAB PHASE II EXERCISE from 05/22/2016 in [facility] CARDIAC REHAB   Date  04/15/16   Educator  RD    Instruction Review Code  Not applicable [class handout given]      Nutrition II class: Lifestyle Skills:  -Group instruction provided by PowerPoint slides, verbal discussion, and written materials to support subject matter. The instructor gives an explanation and review of label reading, grocery shopping for heart health, heart healthy recipe modifications, and ways to make healthier choices when eating out.      CARDIAC REHAB PHASE II EXERCISE from 05/22/2016 in [facility] CARDIAC REHAB   Date  04/15/16   Educator  RD   Instruction Review Code  Not applicable [class handouts given]      Diabetes Question & Answer:  -Group instruction provided by PowerPoint slides, verbal discussion, and written materials to support subject matter. The instructor gives an explanation and review of diabetes co-morbidities, pre- and post-prandial blood glucose goals, pre-exercise blood glucose goals, signs, symptoms, and treatment of hypoglycemia and hyperglycemia, and foot care basics.   Diabetes Blitz:  -Group instruction provided by PowerPoint slides, verbal discussion, and written materials to support subject matter. The instructor gives an explanation and review of the physiology behind type 1 and type 2 diabetes, diabetes medications and rational behind using different medications, pre- and post-prandial blood glucose recommendations and Hemoglobin A1c goals, diabetes diet, and exercise including blood glucose guidelines for exercising safely.       CARDIAC REHAB PHASE II EXERCISE from 05/22/2016 in [facility] CARDIAC REHAB   Date  04/15/16   Educator  RD   Instruction Review Code  Not applicable [class handouts given]      Portion Distortion:  -Group instruction provided by PowerPoint slides, verbal discussion, written materials, and food models to  support subject matter. The instructor gives an explanation of serving size versus portion size, changes in portions sizes  over the last 20 years, and what consists of a serving from each food group.   Stress Management:  -Group instruction provided by verbal instruction, video, and written materials to support subject matter.  Instructors review role of stress in heart disease and how to cope with stress positively.        CARDIAC REHAB PHASE [facility]ERC[facility]   Date  05/22/16   Instruction Review Code  2- meets goals/outcomes      Exercising on Your Own:  -Group instruction provided by verbal instruction, power point, and written materials to support subject.  Instructors discuss benefits of exercise, components of exercise, frequency and intensity of exercise, and end points for exercise.  Also discuss use of nitroglycerin and activating EMS.  Review options of places to exercise outside of rehab.  Review guidelines for sex with heart disease.   Cardiac Drugs I:  -Group instruction provided by verbal instruction and written materials to support subject.  Instructor reviews cardiac drug classes: antiplatelets, anticoagulants, beta blockers, and statins.  Instructor discusses reasons, side effects, and lifestyle considerations for each drug class.   Cardiac Drugs II:  -Group instruction provided by verbal instruction and written materials to support subject.  Instructor reviews cardiac drug classes: angiotensin converting enzyme inhibitors (ACE-I), angiotensin II receptor blockers (ARBs), nitrates, and calcium channel blockers.  Instructor discusses reasons, side effects, and lifestyle considerations for each drug class.   Anatomy and Physiology of the Circulatory System:  -Group instruction provided by verbal instruction, video, and written materials to support subject.  Reviews functional anatomy of heart, how it relates to various diagnoses, and what role the heart plays in the overall system.      CARDIAC REHAB PHASE [facility]   Date  05/01/16   Instruction Review Code  2- meets goals/outcomes      Knowledge Questionnaire Score:     Knowledge Questionnaire Score - 04/10/16 1044    Knowledge Questionnaire Score   Pre Score 21/24               DM 13/15      Core Components/Risk Factors/Patient Goals at Admission:     Personal Goals and Risk Factors at Admission - 04/04/16 0913    Core Components/Risk Factors/Patient Goals on Admission    Weight Management Yes;Obesity   Intervention Weight Management: Develop a combined nutrition and exercise program designed to reach desired caloric intake, while maintaining appropriate intake of nutrient and fiber, sodium and fats, and appropriate energy expenditure required for the weight goal.;Weight Management: Provide education and appropriate resources to help participant work on and attain dietary goals.;Obesity: Provide education and appropriate resources to help participant work on and attain dietary goals.   Admit Weight 300 lb 11.3 oz (136.4 kg)   Goal Weight: Short Term 295 lb (133.811 kg)   Goal Weight: Long Term 290 lb (131.543 kg)   Expected Outcomes Long Term: Adherence to nutrition and physical activity/exercise program aimed toward attainment of established weight goal;Short Term: Continue to assess and modify interventions until short term weight is achieved;Weight Loss: Understanding of general recommendations for a balanced deficit meal plan, which promotes 1-2 lb weight loss per week and includes a negative energy balance of [phone] kcal/d;Understanding recommendations for meals to include 15-35% energy as protein,  25-35% energy from fat, 35-60% energy from carbohydrates, less than 200mg  of dietary cholesterol, 20-35 gm of total fiber daily;Understanding of distribution of calorie intake throughout the day with the consumption of 4-5 meals/snacks   Sedentary Yes   Intervention Provide advice, education, support and  counseling about physical activity/exercise needs.;Develop an individualized exercise prescription for aerobic and resistive training based on initial evaluation findings, risk stratification, comorbidities and participant's personal goals.   Expected Outcomes Achievement of increased cardiorespiratory fitness and enhanced flexibility, muscular endurance and strength shown through measurements of functional capacity and personal statement of participant.   Increase Strength and Stamina Yes   Intervention Provide advice, education, support and counseling about physical activity/exercise needs.;Develop an individualized exercise prescription for aerobic and resistive training based on initial evaluation findings, risk stratification, comorbidities and participant's personal goals.   Expected Outcomes Achievement of increased cardiorespiratory fitness and enhanced flexibility, muscular endurance and strength shown through measurements of functional capacity and personal statement of participant.   Improve shortness of breath with ADL's Yes   Intervention Provide education, individualized exercise plan and daily activity instruction to help decrease symptoms of SOB with activities of daily living.   Expected Outcomes Short Term: Achieves a reduction of symptoms when performing activities of daily living.   Diabetes Yes   Intervention Provide education about signs/symptoms and action to take for hypo/hyperglycemia.;Provide education about proper nutrition, including hydration, and aerobic/resistive exercise prescription along with prescribed medications to achieve blood glucose in normal ranges: Fasting glucose 65-99 mg/dL   Expected Outcomes Short Term: Participant verbalizes understanding of the signs/symptoms and immediate care of hyper/hypoglycemia, proper foot care and importance of medication, aerobic/resistive exercise and nutrition plan for blood glucose control.;Long Term: Attainment of HbA1C < 7%.    Hypertension Yes   Intervention Provide education on lifestyle modifcations including regular physical activity/exercise, weight management, moderate sodium restriction and increased consumption of fresh fruit, vegetables, and low fat dairy, alcohol moderation, and smoking cessation.;Monitor prescription use compliance.   Expected Outcomes Short Term: Continued assessment and intervention until BP is < 140/32mm HG in hypertensive participants. < 130/38mm HG in hypertensive participants with diabetes, heart failure or chronic kidney disease.;Long Term: Maintenance of blood pressure at goal levels.   Lipids Yes   Intervention Provide education and support for participant on nutrition & aerobic/resistive exercise along with prescribed medications to achieve LDL 70mg , HDL >40mg .   Expected Outcomes Long Term: Cholesterol controlled with medications as prescribed, with individualized exercise RX and with personalized nutrition plan. Value goals: LDL < 70mg , HDL > 40 mg.;Short Term: Participant states understanding of desired cholesterol values and is compliant with medications prescribed. Participant is following exercise prescription and nutrition guidelines.   Stress Yes   Intervention Offer individual and/or small group education and counseling on adjustment to heart disease, stress management and health-related lifestyle change. Teach and support self-help strategies.   Expected Outcomes Short Term: Participant demonstrates changes in health-related behavior, relaxation and other stress management skills, ability to obtain effective social support, and compliance with psychotropic medications if prescribed.;Long Term: Emotional wellbeing is indicated by absence of clinically significant psychosocial distress or social isolation.   Personal Goal Other Yes   Personal Goal Get better- be able to walk longer with less SOB   Intervention Provide exercise prescription in program and at home toincrease exercise  strength and endurance   Expected Outcomes increased exercise tolerance, decreased pain in hip with increased movement       Core Components/Risk Factors/Patient Goals Review:  Goals and Risk Factor Review      04/15/16 1136 05/01/16 1428 05/27/16 1716       Core Components/Risk Factors/Patient Goals Review   Personal Goals Review Weight Management/Obesity;Diabetes Weight Management/Obesity;Diabetes;Other Sedentary;Other     Review see 04/15/16 RD note. Pt in the contemplative state of change re: wt loss and the active state of change re: DM control see 04/15/16 RD note. Pt in the contemplative state of change re: wt loss and the active state of change re: DM control Encouraged patient to walk at home and will continue to progress workloads at cardiac reahb as tolerated.     Expected Outcomes 0.5-2 lb wt loss/week and improved CBG control as evidenced by A1c decreasing toward goal of less than 7. 0.5-2 lb wt loss/week and improved CBG control as evidenced by A1c decreasing toward goal of less than 7. Exercise consistently at cardiac rehab and at home.        Core Components/Risk Factors/Patient Goals at Discharge (Final Review):      Goals and Risk Factor Review - 05/27/16 1716    Core Components/Risk Factors/Patient Goals Review   Personal Goals Review Sedentary;Other   Review Encouraged patient to walk at home and will continue to progress workloads at cardiac reahb as tolerated.   Expected Outcomes Exercise consistently at cardiac rehab and at home.      ITP Comments:     ITP Comments      04/02/16 1017           ITP Comments Dr. Armanda Magic, Medical Director          Comments: *Pt is making expected progress toward personal goals after completing 18 sessions. Recommend continued exercise and life style modification education including  stress management and relaxation techniques to decrease cardiac risk profile. Ron continues to make slow improvement with  exercise.Gladstone Lighter, RN,BSN 06/06/2016 6:00 PM

## 2016-06-07 ENCOUNTER — Encounter (HOSPITAL_COMMUNITY): Payer: BLUE CROSS/BLUE SHIELD

## 2016-06-07 ENCOUNTER — Telehealth (HOSPITAL_COMMUNITY): Payer: Self-pay

## 2016-06-10 ENCOUNTER — Telehealth (HOSPITAL_COMMUNITY): Payer: Self-pay

## 2016-06-10 ENCOUNTER — Encounter (HOSPITAL_COMMUNITY): Payer: BLUE CROSS/BLUE SHIELD

## 2016-06-12 ENCOUNTER — Encounter (HOSPITAL_COMMUNITY)
Admission: RE | Admit: 2016-06-12 | Discharge: 2016-06-12 | Disposition: A | Discharge status: Home / Self Care | Payer: BLUE CROSS/BLUE SHIELD | Source: Ambulatory Visit | Attending: Internal Medicine | Admitting: Internal Medicine

## 2016-06-12 DIAGNOSIS — Z951 Presence of aortocoronary bypass graft: Secondary | ICD-10-CM | POA: Diagnosis not present

## 2016-06-13 LAB — GLUCOSE, CAPILLARY: GLUCOSE-CAPILLARY: 177 mg/dL — AB (ref 65–99)

## 2016-06-14 ENCOUNTER — Encounter (HOSPITAL_COMMUNITY)
Admission: RE | Admit: 2016-06-14 | Discharge: 2016-06-14 | Disposition: A | Discharge status: Home / Self Care | Payer: BLUE CROSS/BLUE SHIELD | Source: Ambulatory Visit | Attending: Internal Medicine | Admitting: Internal Medicine

## 2016-06-14 DIAGNOSIS — Z951 Presence of aortocoronary bypass graft: Secondary | ICD-10-CM | POA: Diagnosis not present

## 2016-06-14 NOTE — Progress Notes (Signed)
Nutrition Note Spoke with pt. Pt reports CBG decreased to 90 mg/dL yesterday due to "I was working most of the day." After dinner and 2 hours before insulin injection due, pt checked his CBG and it was 126 mg/dL. Pt then fell asleep for the evening and woke with a fasting CBG of 216 mg/dL. Pt did not take any of his medication and arrived at [facility] haven eaten only a banana. Pre-exercise CBG 197 mg/dL. Pt plans on taking his medications including insulin and eating after exercise. Pt's decision-making process re: medications discussed. Pt reports having started Metformin last week "I've taken about 4 doses." Pt denies any GI s/s associated with Metformin. Metformin's mechanism of action discussed. Continue client-centered nutrition education by RD as part of interdisciplinary care.  Monitor and evaluate progress toward nutrition goal with team.  Gavin PlumbEdna Lakeia Lawson, M.Ed, RD, LDN, CDE 06/14/2016 11:51 AM

## 2016-06-17 ENCOUNTER — Encounter (HOSPITAL_COMMUNITY)
Admission: RE | Admit: 2016-06-17 | Discharge: 2016-06-17 | Disposition: A | Discharge status: Home / Self Care | Payer: BLUE CROSS/BLUE SHIELD | Source: Ambulatory Visit | Attending: Internal Medicine | Admitting: Internal Medicine

## 2016-06-17 DIAGNOSIS — I2510 Atherosclerotic heart disease of native coronary artery without angina pectoris: Secondary | ICD-10-CM | POA: Insufficient documentation

## 2016-06-17 DIAGNOSIS — J449 Chronic obstructive pulmonary disease, unspecified: Secondary | ICD-10-CM | POA: Diagnosis not present

## 2016-06-17 DIAGNOSIS — Z951 Presence of aortocoronary bypass graft: Secondary | ICD-10-CM

## 2016-06-17 DIAGNOSIS — K219 Gastro-esophageal reflux disease without esophagitis: Secondary | ICD-10-CM | POA: Diagnosis not present

## 2016-06-17 DIAGNOSIS — Z794 Long term (current) use of insulin: Secondary | ICD-10-CM | POA: Insufficient documentation

## 2016-06-17 DIAGNOSIS — E119 Type 2 diabetes mellitus without complications: Secondary | ICD-10-CM | POA: Insufficient documentation

## 2016-06-17 DIAGNOSIS — Z79899 Other long term (current) drug therapy: Secondary | ICD-10-CM | POA: Diagnosis not present

## 2016-06-17 DIAGNOSIS — Z7982 Long term (current) use of aspirin: Secondary | ICD-10-CM | POA: Diagnosis not present

## 2016-06-17 DIAGNOSIS — I10 Essential (primary) hypertension: Secondary | ICD-10-CM | POA: Diagnosis not present

## 2016-06-17 LAB — GLUCOSE, CAPILLARY
GLUCOSE-CAPILLARY: 174 mg/dL — AB (ref 65–99)
Glucose-Capillary: 197 mg/dL — ABNORMAL HIGH (ref 65–99)

## 2016-06-19 ENCOUNTER — Encounter (HOSPITAL_COMMUNITY)
Admission: RE | Admit: 2016-06-19 | Discharge: 2016-06-19 | Disposition: A | Discharge status: Home / Self Care | Payer: BLUE CROSS/BLUE SHIELD | Source: Ambulatory Visit | Attending: Internal Medicine | Admitting: Internal Medicine

## 2016-06-19 DIAGNOSIS — Z951 Presence of aortocoronary bypass graft: Secondary | ICD-10-CM

## 2016-06-19 LAB — GLUCOSE, CAPILLARY: GLUCOSE-CAPILLARY: 210 mg/dL — AB (ref 65–99)

## 2016-06-21 ENCOUNTER — Encounter (HOSPITAL_COMMUNITY)
Admission: RE | Admit: 2016-06-21 | Discharge: 2016-06-21 | Disposition: A | Discharge status: Home / Self Care | Payer: BLUE CROSS/BLUE SHIELD | Source: Ambulatory Visit | Attending: Internal Medicine | Admitting: Internal Medicine

## 2016-06-21 DIAGNOSIS — Z951 Presence of aortocoronary bypass graft: Secondary | ICD-10-CM | POA: Diagnosis not present

## 2016-06-21 LAB — GLUCOSE, CAPILLARY
GLUCOSE-CAPILLARY: 180 mg/dL — AB (ref 65–99)
GLUCOSE-CAPILLARY: 124 mg/dL — AB (ref 65–99)

## 2016-06-24 ENCOUNTER — Encounter (HOSPITAL_COMMUNITY)
Admission: RE | Admit: 2016-06-24 | Discharge: 2016-06-24 | Disposition: A | Discharge status: Home / Self Care | Payer: BLUE CROSS/BLUE SHIELD | Source: Ambulatory Visit | Attending: Internal Medicine | Admitting: Internal Medicine

## 2016-06-24 DIAGNOSIS — Z951 Presence of aortocoronary bypass graft: Secondary | ICD-10-CM | POA: Diagnosis not present

## 2016-06-24 LAB — GLUCOSE, CAPILLARY: GLUCOSE-CAPILLARY: 185 mg/dL — AB (ref 65–99)

## 2016-06-26 ENCOUNTER — Encounter (HOSPITAL_COMMUNITY): Payer: BLUE CROSS/BLUE SHIELD

## 2016-06-28 ENCOUNTER — Encounter (HOSPITAL_COMMUNITY)
Admission: RE | Admit: 2016-06-28 | Discharge: 2016-06-28 | Disposition: A | Discharge status: Home / Self Care | Payer: BLUE CROSS/BLUE SHIELD | Source: Ambulatory Visit | Attending: Internal Medicine | Admitting: Internal Medicine

## 2016-06-28 DIAGNOSIS — Z951 Presence of aortocoronary bypass graft: Secondary | ICD-10-CM | POA: Diagnosis not present

## 2016-06-28 LAB — GLUCOSE, CAPILLARY: Glucose-Capillary: 180 mg/dL — ABNORMAL HIGH (ref 65–99)

## 2016-06-28 NOTE — Progress Notes (Signed)
Cardiac Individual Treatment Plan  Patient Details  Name: Gavin Lawson MRN: [id number] Date of Birth: 1951/01/21 Referring Provider:        CARDIAC REHAB PHASE II ORIENTATION from 04/04/2016 in [facility] CARDI[facility]   Referring Provider  Cloria Spring MD      Initial Encounter Date:       CARDIAC REHAB PHASE II ORIENTATION from 04/04/2016 in [facility] CARDIAC REHAB   Date  04/04/16   Referring Provider  Cloria Spring MD      Visit Diagnosis: S/P CABG x 3  Patient's Home Medications on Admission:  Current outpatient prescriptions:  .  acetaminophen (TYLENOL) 500 MG tablet, Take 1,000 mg by mouth 3 (three) times daily. , Disp: , Rfl:  .  aspirin 81 MG chewable tablet, Chew 162 mg by mouth daily., Disp: , Rfl:  .  carvedilol (COREG) 12.5 MG tablet, Take 12.5 mg by mouth 2 (two) times daily with a meal., Disp: , Rfl:  .  furosemide (LASIX) 40 MG tablet, Take 1 tablet (40 mg total) by mouth daily., Disp: 30 tablet, Rfl: 0 .  insulin NPH-regular Human (NOVOLIN 70/30) (70-30) 100 UNIT/ML injection, Inject 20-27 Units into the skin 2 (two) times daily with a meal. 22 units with breakfast and 26 units with dinner, Disp: , Rfl:  .  lisinopril (PRINIVIL,ZESTRIL) 5 MG tablet, Take 5 mg by mouth 2 (two) times daily. , Disp: , Rfl:  .  oxyCODONE (OXY IR/ROXICODONE) 5 MG immediate release tablet, Take 5 mg by mouth 2 (two) times daily as needed for severe pain., Disp: , Rfl:  .  pantoprazole (PROTONIX) 40 MG tablet, Take 40 mg by mouth daily., Disp: , Rfl:  .  pravastatin (PRAVACHOL) 40 MG tablet, Take 40 mg by mouth daily., Disp: , Rfl:  .  sennosides-docusate sodium (SENOKOT-S) 8.6-50 MG tablet, Take 2 tablets by mouth at bedtime., Disp: , Rfl:  .  traMADol (ULTRAM) 50 MG tablet, Take 50 mg by mouth 2 (two) times daily as needed. Reported on 04/08/2016, Disp: , Rfl:   Past Medical History: Past Medical History  Diagnosis Date  . COPD (chronic  obstructive pulmonary disease) (HCC)   . Hypertension   . Shortness of breath dyspnea   . Diabetes mellitus     INSULIN DEPENDENT  . Depression   . GERD (gastroesophageal reflux disease)   . Headache   . Arthritis   . Sciatic nerve pain   . Back pain   . Coronary artery disease 01/02/16    CABGx3 LIMA    Tobacco Use: History  Smoking status  . Never Smoker   Smokeless tobacco  . Never Used    Comment: exposed to second hand smoke     Labs:     Recent Review Flowsheet Data    Labs for ITP Cardiac and Pulmonary Rehab Latest Ref Rng 09/28/2015   Cholestrol 0 - 200 mg/dL 409(W)   LDLCALC 0 - 99 mg/dL 119(J)   HDL >47 mg/dL 52   Trlycerides <829 mg/dL 95   Hemoglobin F6O 4.8 - 5.6 % 9.5(H)      Capillary Blood Glucose: Lab Results  Component Value Date   GLUCAP 172* 07/03/2016   GLUCAP 196* 07/03/2016   GLUCAP 167* 07/01/2016   GLUCAP 180* 06/28/2016   GLUCAP 185* 06/24/2016     Exercise Target Goals:    Exercise Program Goal: Individual exercise prescription set with THRR, safety & activity barriers. Participant demonstrates ability to understand  and report RPE using BORG scale, to self-measure pulse accurately, and to acknowledge the importance of the exercise prescription.  Exercise Prescription Goal: Starting with aerobic activity 30 plus minutes a day, 3 days per week for initial exercise prescription. Provide home exercise prescription and guidelines that participant acknowledges understanding prior to discharge.  Activity Barriers & Risk Stratification:     Activity Barriers & Cardiac Risk Stratification - 04/04/16 0909    Activity Barriers & Cardiac Risk Stratification   Activity Barriers Arthritis;Back Problems;Deconditioning;Joint Problems;Other (comment)   Comments possible R/L hip surgery   Cardiac Risk Stratification High      6 Minute Walk:     6 Minute Walk      04/04/16 1351       6 Minute Walk   Phase Initial     Distance 600  feet     Walk Time 5.3 minutes     # of Rest Breaks 2  14 sec and 32 secs     MPH 1.2     METS 0.93     RPE 11     VO2 Peak 3.3     Symptoms Yes (comment)     Comments 8/10 back pain     Resting HR 62 bpm     Resting BP 142/72 mmHg     Max Ex. HR 81 bpm     Max Ex. BP 146/82 mmHg     2 Minute Post BP 132/82 mmHg        Initial Exercise Prescription:     Initial Exercise Prescription - 04/04/16 1300    Date of Initial Exercise RX and Referring Provider   Date 04/04/16   Referring Provider Cloria Spring MD   NuStep   Level --   Minutes --   METs --   T5 Nustep   Level 2   Minutes 30   METs 1.5   Prescription Details   Frequency (times per week) 3   Duration Progress to 30 minutes of continuous aerobic without signs/symptoms of physical distress   Intensity   THRR 40-80% of Max Heartrate 63-126   Ratings of Perceived Exertion 11-13   Progression   Progression Continue to progress workloads to maintain intensity without signs/symptoms of physical distress.   Resistance Training   Training Prescription Yes   Weight 1   Reps 10-12      Perform Capillary Blood Glucose checks as needed.  Exercise Prescription Changes:      Exercise Prescription Changes      04/08/16 1300 04/15/16 1100 04/16/16 0800 05/01/16 1300 05/17/16 1400   Exercise Review   Progression No  Pt started exercise 4/24 No  Pt started exercise 4/24 Yes Yes Yes   Response to Exercise   Blood Pressure (Admit) 134/72 mmHg 134/72 mmHg 142/70 mmHg 116/61 mmHg 114/70 mmHg   Blood Pressure (Exercise) 148/70 mmHg 148/70 mmHg 128/70 mmHg 138/70 mmHg 138/82 mmHg   Blood Pressure (Exit) 138/80 mmHg 138/80 mmHg 122/70 mmHg 112/60 mmHg 118/64 mmHg   Heart Rate (Admit) 67 bpm 67 bpm 66 bpm 73 bpm 71 bpm   Heart Rate (Exercise) 71 bpm 71 bpm 82 bpm 98 bpm 97 bpm   Heart Rate (Exit) 55 bpm 55 bpm 61 bpm 73 bpm 67 bpm   Rating of Perceived Exertion (Exercise) 11 11 13 11 12    Symptoms Chronic hip/leg pain  Chronic hip/leg pain Chronic hip/leg pain Chronic hip/leg pain Chronic hip/leg pain   Comments  Home Exercise Given 04/15/16 Home  Exercise Given 04/15/16 Home Exercise Given 04/15/16 Home Exercise Given 04/15/16   Duration Progress to 30 minutes of continuous aerobic without signs/symptoms of physical distress Progress to 30 minutes of continuous aerobic without signs/symptoms of physical distress Progress to 30 minutes of continuous aerobic without signs/symptoms of physical distress Progress to 30 minutes of continuous aerobic without signs/symptoms of physical distress Progress to 30 minutes of continuous aerobic without signs/symptoms of physical distress   Intensity THRR unchanged THRR unchanged THRR unchanged THRR unchanged THRR unchanged   Progression   Progression Continue to progress workloads to maintain intensity without signs/symptoms of physical distress. Continue to progress workloads to maintain intensity without signs/symptoms of physical distress. Continue to progress workloads to maintain intensity without signs/symptoms of physical distress. Continue to progress workloads to maintain intensity without signs/symptoms of physical distress. Continue to progress workloads to maintain intensity without signs/symptoms of physical distress.   Average METs 1.8 1.8 1.8 2.4 2.5   Resistance Training   Training Prescription Yes Yes Yes Yes Yes   Weight 1 1 2  lbs 2 2   Reps 10-12 10-12 10-12 10-12 10-12   Interval Training   Interval Training No No No No No   T5 Nustep   Level 2 2 2 4 4    Minutes 20 20 30 30 30    METs 1.5 1.5 1.8 2.6 2.6   Home Exercise Plan   Plans to continue exercise at  Home  Walking and Chair Exercises Home  Walking and Chair Exercises Home Home   Frequency  Add 2 additional days to program exercise sessions. Add 2 additional days to program exercise sessions. Add 2 additional days to program exercise sessions. Add 2 additional days to program exercise sessions.      05/27/16 1700 06/14/16 1400 06/28/16 1700       Exercise Review   Progression  Yes Yes     Response to Exercise   Blood Pressure (Admit) 104/70 mmHg 110/70 mmHg 148/70 mmHg     Blood Pressure (Exercise) 153/70 mmHg 148/80 mmHg 130/70 mmHg     Blood Pressure (Exit) 114/62 mmHg 108/60 mmHg 110/60 mmHg     Heart Rate (Admit) 72 bpm 71 bpm 76 bpm     Heart Rate (Exercise) 100 bpm 103 bpm 93 bpm     Heart Rate (Exit) 72 bpm 69 bpm 69 bpm     Rating of Perceived Exertion (Exercise) 111 12 11     Symptoms Low blood sugar today.  Fatigue, long work day previous day.      Comments Home Exercise Given 04/15/16 Home Exercise Given 04/15/16 Home Exercise Given 04/15/16     Duration Progress to 30 minutes of continuous aerobic without signs/symptoms of physical distress Progress to 30 minutes of continuous aerobic without signs/symptoms of physical distress Progress to 30 minutes of continuous aerobic without signs/symptoms of physical distress     Intensity THRR unchanged THRR unchanged THRR unchanged     Progression   Progression  Continue to progress workloads to maintain intensity without signs/symptoms of physical distress. Continue to progress workloads to maintain intensity without signs/symptoms of physical distress.     Average METs 2.4 2.8 3     Resistance Training   Training Prescription Yes Yes Yes     Weight 2 2 2      Reps 10-12 10-12 10-12     Interval Training   Interval Training No No No     T5 Nustep   Level 4 4 4  Minutes 30 30 30      METs 2.4 2.8 3     Home Exercise Plan   Plans to continue exercise at [facility]     Frequency Add 2 additional days to program exercise sessions. Add 2 additional days to program exercise sessions. Add 2 additional days to program exercise sessions.        Exercise Comments:      Exercise Comments      04/08/16 1354 04/16/16 0851 05/01/16 1350 05/17/16 1417 05/27/16 1718   Exercise Comments Pt started exercise today.  He is going to be  limited by his right leg and hip.  Encouraged rest breaks to ease pain. Pt is tolerating his exercise despite his chronic pain.  We have discussed icing after exercise to help keep down inflammation. Pt is limited by chronic hip/sciatic nerve pain but is no longer experiencing SOB. Making slow, steady progress with exercise. Low blood suagr today. Progressing workloads as able.     06/14/16 1437 06/28/16 1658         Exercise Comments Encouraged patient to increase pace on the recumbent stepper. Pt states he's walking at work.          Discharge Exercise Prescription (Final Exercise Prescription Changes):     Exercise Prescription Changes - 06/28/16 1700    Exercise Review   Progression Yes   Response to Exercise   Blood Pressure (Admit) 148/70 mmHg   Blood Pressure (Exercise) 130/70 mmHg   Blood Pressure (Exit) 110/60 mmHg   Heart Rate (Admit) 76 bpm   Heart Rate (Exercise) 93 bpm   Heart Rate (Exit) 69 bpm   Rating of Perceived Exertion (Exercise) 11   Comments Home Exercise Given 04/15/16   Duration Progress to 30 minutes of continuous aerobic without signs/symptoms of physical distress   Intensity THRR unchanged   Progression   Progression Continue to progress workloads to maintain intensity without signs/symptoms of physical distress.   Average METs 3   Resistance Training   Training Prescription Yes   Weight 2   Reps 10-12   Interval Training   Interval Training No   T5 Nustep   Level 4   Minutes 30   METs 3   Home Exercise Plan   Plans to continue exercise at Home   Frequency Add 2 additional days to program exercise sessions.      Nutrition:  Target Goals: Understanding of nutrition guidelines, daily intake of sodium 1500mg , cholesterol 200mg , calories 30% from fat and 7% or less from saturated fats, daily to have 5 or more servings of fruits and vegetables.  Biometrics:     Pre Biometrics - 04/04/16 1416    Pre Biometrics   Waist Circumference 54 inches    Hip Circumference 53 inches   Waist to Hip Ratio 1.02 %   Triceps Skinfold 34 mm   % Body Fat 44 %   Grip Strength 37 kg   Flexibility 0 in   Single Leg Stand 0 seconds       Nutrition Therapy Plan and Nutrition Goals:     Nutrition Therapy & Goals - 04/04/16 1025    Nutrition Therapy   Diet Diabetic, Therapeutic Lifestyle Changes   Personal Nutrition Goals   Personal Goal #1 Wt loss of 0.5-2 lb/week to a goal wt loss of 6-24 lb at graduation from Cardiac Rehab   Personal Goal #2 Improve blood glucose control as evidenced by a decrease in HbgA1c from 9.5 09/28/15 toward the  desired goal of < 7.0   Intervention Plan   Intervention Prescribe, educate and counsel regarding individualized specific dietary modifications aiming towards targeted core components such as weight, hypertension, lipid management, diabetes, heart failure and other comorbidities.   Expected Outcomes Short Term Goal: Understand basic principles of dietary content, such as calories, fat, sodium, cholesterol and nutrients.;Long Term Goal: Adherence to prescribed nutrition plan.      Nutrition Discharge: Nutrition Scores:     Nutrition Assessments - 04/10/16 1043    MEDFICTS Scores   Pre Score 63  will verify score with pt      Nutrition Goals Re-Evaluation:   Psychosocial: Target Goals: Acknowledge presence or absence of depression, maximize coping skills, provide positive support system. Participant is able to verbalize types and ability to use techniques and skills needed for reducing stress and depression.  Initial Review & Psychosocial Screening:     Initial Psych Review & Screening - 04/10/16 1016    Family Dynamics   Good Support System? No   Strains Intra-family strains   Comments Patient says he has some financial and family issues he did not want to elaborate on.      Quality of Life Scores:     Quality of Life - 04/11/16 1312    Quality of Life Scores   Health/Function Pre --   Patient does not feel good about his health due to being overwieight and his CAD.   Socioeconomic Pre --  Patient lives with a friend but does not have a lot of friends outside his home   Psych/Spiritual Pre --  Patient has a strong belief in God   GLOBAL Pre --  Will forward quality of life to patients cardiologist      PHQ-9:     Recent Review Flowsheet Data    Depression screen [facility] 2/9 04/08/2016   Decreased Interest 1   Down, Depressed, Hopeless 1   PHQ - 2 Score 2   Altered sleeping 0   Tired, decreased energy 0   Change in appetite 0   Feeling bad or failure about yourself  1   Trouble concentrating 1    Moving slowly or fidgety/restless 0   Suicidal thoughts 0   PHQ-9 Score 4   Difficult doing work/chores Somewhat difficult      Psychosocial Evaluation and Intervention:   Psychosocial Re-Evaluation:     Psychosocial Re-Evaluation      05/09/16 1236 06/06/16 1758 06/28/16 1517       Psychosocial Re-Evaluation   Interventions Stress management education;Encouraged to attend Cardiac Rehabilitation for the exercise Stress management education;Encouraged to attend Cardiac Rehabilitation for the exercise Encouraged to attend Cardiac Rehabilitation for the exercise     Continued Psychosocial Services Needed No No No        Vocational Rehabilitation: Provide vocational rehab assistance to qualifying candidates.   Vocational Rehab Evaluation & Intervention:     Vocational Rehab - 04/04/16 1623    Initial Vocational Rehab Evaluation & Intervention   Assessment shows need for Vocational Rehabilitation No      Education: Education Goals: Education classes will be provided on a weekly basis, covering required topics. Participant will state understanding/return demonstration of topics presented.  Learning Barriers/Preferences:     Learning Barriers/Preferences - 04/04/16 1231    Learning Barriers/Preferences   Learning Preferences Individual  Instruction;Verbal Instruction;Video;Written Material      Education Topics: Count Your Pulse:  -Group instruction provided by verbal instruction, demonstration, patient participation and written materials to  support subject.  Instructors address importance of being able to find your pulse and how to count your pulse when at home without a heart monitor.  Patients get hands on experience counting their pulse with staff help and individually.      CARDIAC REHAB PHASE II EXERCISE from 05/22/2016 in [facility] CARDIAC REHAB   Date  04/19/16   Educator  Deveron Furlong   Instruction Review Code  2- meets goals/outcomes      Heart Attack, Angina, and Risk Factor Modification:  -Group instruction provided by verbal instruction, video, and written materials to support subject.  Instructors address signs and symptoms of angina and heart attacks.    Also discuss risk factors for heart disease and how to make changes to improve heart health risk factors.      CARDIAC REHAB PHASE II EXERCISE from 05/22/2016 in [facility] CARDIAC REHAB   Date  04/17/16   Instruction Review Code  2- meets goals/outcomes      Functional Fitness:  -Group instruction provided by verbal instruction, demonstration, patient participation, and written materials to support subject.  Instructors address safety measures for doing things around the house.  Discuss how to get up and down off the floor, how to pick things up properly, how to safely get out of a chair without assistance, and balance training.   Meditation and Mindfulness:  -Group instruction provided by verbal instruction, patient participation, and written materials to support subject.  Instructor addresses importance of mindfulness and meditation practice to help reduce stress and improve awareness.  Instructor also leads participants through a meditation exercise.    Stretching for Flexibility and Mobility:  -Group instruction  provided by verbal instruction, patient participation, and written materials to support subject.  Instructors lead participants through series of stretches that are designed to increase flexibility thus improving mobility.  These stretches are additional exercise for major muscle groups that are typically performed during regular warm up and cool down.   Hands Only CPR Anytime:  -Group instruction provided by verbal instruction, video, patient participation and written materials to support subject.  Instructors co-teach with AHA video for hands only CPR.  Participants get hands on experience with mannequins.   Nutrition I class: Heart Healthy Eating:  -Group instruction provided by PowerPoint slides, verbal discussion, and written materials to support subject matter. The instructor gives an explanation and review of the Therapeutic Lifestyle Changes diet recommendations, which includes a discussion on lipid goals, dietary fat, sodium, fiber, plant stanol/sterol esters, sugar, and the components of a well-balanced, healthy diet.      CARDIAC REHAB PHASE II EXERCISE from 05/22/2016 in [facility] CARDIAC REHAB   Date  04/15/16   Educator  RD   Instruction Review Code  Not applicable [class handout given]      Nutrition II class: Lifestyle Skills:  -Group instruction provided by PowerPoint slides, verbal discussion, and written materials to support subject matter. The instructor gives an explanation and review of label reading, grocery shopping for heart health, heart healthy recipe modifications, and ways to make healthier choices when eating out.      CARDIAC REHAB PHASE II EXERCISE from 05/22/2016 in [facility] CARDIAC REHAB   Date  04/15/16   Educator  RD   Instruction Review Code  Not applicable [class handouts given]      Diabetes Question & Answer:  -Group instruction provided by PowerPoint slides, verbal discussion, and written materials to support  subject matter. The instructor gives an explanation and review of diabetes co-morbidities, pre- and post-prandial blood glucose goals, pre-exercise blood glucose goals, signs, symptoms, and treatment of hypoglycemia and hyperglycemia, and foot care basics.   Diabetes Blitz:  -Group instruction provided by PowerPoint slides, verbal discussion, and written materials to support subject matter. The instructor gives an explanation and review of the physiology behind type 1 and type 2 diabetes, diabetes medications and rational behind using different medications, pre- and post-prandial blood glucose recommendations and Hemoglobin A1c goals, diabetes diet, and exercise including blood glucose guidelines for exercising safely.       CARDIAC REHAB PHASE II EXERCISE from 05/22/2016 in [facility] CARDIAC REHAB   Date  04/15/16   Educator  RD   Instruction Review Code  Not applicable [class handouts given]      Portion Distortion:  -Group instruction provided by PowerPoint slides, verbal discussion, written materials, and food models to support subject matter. The instructor gives an explanation of serving size versus portion size, changes in portions sizes over the last 20 years, and what consists of a serving from each food group.   Stress Management:  -Group instruction provided by verbal instruction, video, and written materials to support subject matter.  Instructors review role of stress in heart disease and how to cope with stress positively.        CARDIAC REHAB PHASE II EXERCISE from 05/22/2016 in [facility] CARDIAC REHAB   Date  05/22/16   Instruction Review Code  2- meets goals/outcomes      Exercising on Your Own:  -Group instruction provided by verbal instruction, power point, and written materials to support subject.  Instructors discuss benefits of exercise, components of exercise, frequency and intensity of exercise, and end points for exercise.  Also  discuss use of nitroglycerin and activating EMS.  Review options of places to exercise outside of rehab.  Review guidelines for sex with heart disease.   Cardiac Drugs I:  -Group instruction provided by verbal instruction and written materials to support subject.  Instructor reviews cardiac drug classes: antiplatelets, anticoagulants, beta blockers, and statins.  Instructor discusses reasons, side effects, and lifestyle considerations for each drug class.   Cardiac Drugs II:  -Group instruction provided by verbal instruction and written materials to support subject.  Instructor reviews cardiac drug classes: angiotensin converting enzyme inhibitors (ACE-I), angiotensin II receptor blockers (ARBs), nitrates, and calcium channel blockers.  Instructor discusses reasons, side effects, and lifestyle considerations for each drug class.   Anatomy and Physiology of the Circulatory System:  -Group instruction provided by verbal instruction, video, and written materials to support subject.  Reviews functional anatomy of heart, how it relates to various diagnoses, and what role the heart plays in the overall system.          CARDIAC REHAB PHASE II EXERCISE from 05/22/2016 in [facility] CARDIAC REHAB   Date  05/01/16   Instruction Review Code  2- meets goals/outcomes      Knowledge Questionnaire Score:     Knowledge Questionnaire Score - 04/10/16 1044    Knowledge Questionnaire Score   Pre Score 21/24               DM 13/15      Core Components/Risk Factors/Patient Goals at Admission:     Personal Goals and Risk Factors at Admission - 04/04/16 0913    Core Components/Risk Factors/Patient Goals on Admission    Weight Management Yes;Obesity  Intervention Weight Management: Develop a combined nutrition and exercise program designed to reach desired caloric intake, while maintaining appropriate intake of nutrient and fiber, sodium and fats, and appropriate energy expenditure  required for the weight goal.;Weight Management: Provide education and appropriate resources to help participant work on and attain dietary goals.;Obesity: Provide education and appropriate resources to help participant work on and attain dietary goals.   Admit Weight 300 lb 11.3 oz (136.4 kg)   Goal Weight: Short Term 295 lb (133.811 kg)   Goal Weight: Long Term 290 lb (131.543 kg)   Expected Outcomes Long Term: Adherence to nutrition and physical activity/exercise program aimed toward attainment of established weight goal;Short Term: Continue to assess and modify interventions until short term weight is achieved;Weight Loss: Understanding of general recommendations for a balanced deficit meal plan, which promotes 1-2 lb weight loss per week and includes a negative energy balance of [phone] kcal/d;Understanding recommendations for meals to include 15-35% energy as protein, 25-35% energy from fat, 35-60% energy from carbohydrates, less than 200mg  of dietary cholesterol, 20-35 gm of total fiber daily;Understanding of distribution of calorie intake throughout the day with the consumption of 4-5 meals/snacks   Sedentary Yes   Intervention Provide advice, education, support and counseling about physical activity/exercise needs.;Develop an individualized exercise prescription for aerobic and resistive training based on initial evaluation findings, risk stratification, comorbidities and participant's personal goals.   Expected Outcomes Achievement of increased cardiorespiratory fitness and enhanced flexibility, muscular endurance and strength shown through measurements of functional capacity and personal statement of participant.   Increase Strength and Stamina Yes   Intervention Provide advice, education, support and counseling about physical activity/exercise needs.;Develop an individualized exercise prescription for aerobic and resistive training based on initial evaluation findings, risk stratification,  comorbidities and participant's personal goals.   Expected Outcomes Achievement of increased cardiorespiratory fitness and enhanced flexibility, muscular endurance and strength shown through measurements of functional capacity and personal statement of participant.   Improve shortness of breath with ADL's Yes   Intervention Provide education, individualized exercise plan and daily activity instruction to help decrease symptoms of SOB with activities of daily living.   Expected Outcomes Short Term: Achieves a reduction of symptoms when performing activities of daily living.   Diabetes Yes   Intervention Provide education about signs/symptoms and action to take for hypo/hyperglycemia.;Provide education about proper nutrition, including hydration, and aerobic/resistive exercise prescription along with prescribed medications to achieve blood glucose in normal ranges: Fasting glucose 65-99 mg/dL   Expected Outcomes Short Term: Participant verbalizes understanding of the signs/symptoms and immediate care of hyper/hypoglycemia, proper foot care and importance of medication, aerobic/resistive exercise and nutrition plan for blood glucose control.;Long Term: Attainment of HbA1C < 7%.   Hypertension Yes   Intervention Provide education on lifestyle modifcations including regular physical activity/exercise, weight management, moderate sodium restriction and increased consumption of fresh fruit, vegetables, and low fat dairy, alcohol moderation, and smoking cessation.;Monitor prescription use compliance.   Expected Outcomes Short Term: Continued assessment and intervention until BP is < 140/3890mm HG in hypertensive participants. < 130/3580mm HG in hypertensive participants with diabetes, heart failure or chronic kidney disease.;Long Term: Maintenance of blood pressure at goal levels.   Lipids Yes   Intervention Provide education and support for participant on nutrition & aerobic/resistive exercise along with prescribed  medications to achieve LDL 70mg , HDL >40mg .   Expected Outcomes Long Term: Cholesterol controlled with medications as prescribed, with individualized exercise RX and with personalized nutrition plan. Value goals: LDL < 70mg ,  HDL > 40 mg.;Short Term: Participant states understanding of desired cholesterol values and is compliant with medications prescribed. Participant is following exercise prescription and nutrition guidelines.   Stress Yes   Intervention Offer individual and/or small group education and counseling on adjustment to heart disease, stress management and health-related lifestyle change. Teach and support self-help strategies.   Expected Outcomes Short Term: Participant demonstrates changes in health-related behavior, relaxation and other stress management skills, ability to obtain effective social support, and compliance with psychotropic medications if prescribed.;Long Term: Emotional wellbeing is indicated by absence of clinically significant psychosocial distress or social isolation.   Personal Goal Other Yes   Personal Goal Get better- be able to walk longer with less SOB   Intervention Provide exercise prescription in program and at home toincrease exercise strength and endurance   Expected Outcomes increased exercise tolerance, decreased pain in hip with increased movement       Core Components/Risk Factors/Patient Goals Review:      Goals and Risk Factor Review      04/15/16 1136 05/01/16 1428 05/27/16 1716 06/28/16 1700     Core Components/Risk Factors/Patient Goals Review   Personal Goals Review Weight Management/Obesity;Diabetes Weight Management/Obesity;Diabetes;Other Sedentary;Other Improve shortness of breath with ADL's;Other    Review see 12/21/08 RD note. Pt in the contemplative state of change re: wt loss and the active state of change re: DM control see 04/15/16 RD note. Pt in the contemplative state of change re: wt loss and the active state of change re: DM control  Encouraged patient to walk at home and will continue to progress workloads at cardiac reahb as tolerated. Pt states he's walking at work. Pt feels he's less short of breath when walking distances.    Expected Outcomes 0.5-2 lb wt loss/week and improved CBG control as evidenced by A1c decreasing toward goal of less than 7. 0.5-2 lb wt loss/week and improved CBG control as evidenced by A1c decreasing toward goal of less than 7. Exercise consistently at cardiac rehab and at home. Exercise consistently at cardiac rehab and at home.       Core Components/Risk Factors/Patient Goals at Discharge (Final Review):      Goals and Risk Factor Review - 06/28/16 1700    Core Components/Risk Factors/Patient Goals Review   Personal Goals Review Improve shortness of breath with ADL's;Other   Review Pt states he's walking at work. Pt feels he's less short of breath when walking distances.   Expected Outcomes Exercise consistently at cardiac rehab and at home.      ITP Comments:     ITP Comments      04/02/16 1017           ITP Comments Dr. Armanda Magic, Medical Director          Comments: Pt is making expected progress toward personal goals after completing 25 sessions. Recommend continued exercise and life style modification education including  stress management and relaxation techniques to decrease cardiac risk profile. Ron is enjoying participating in phase 2 cardiac rehab. Ron's CBG's have been better controlled.Gladstone Lighter, RN,BSN 07/04/2016 11:03 AM

## 2016-07-01 ENCOUNTER — Encounter (HOSPITAL_COMMUNITY)
Admission: RE | Admit: 2016-07-01 | Discharge: 2016-07-01 | Disposition: A | Discharge status: Home / Self Care | Payer: BLUE CROSS/BLUE SHIELD | Source: Ambulatory Visit | Attending: Internal Medicine | Admitting: Internal Medicine

## 2016-07-01 DIAGNOSIS — Z951 Presence of aortocoronary bypass graft: Secondary | ICD-10-CM

## 2016-07-01 LAB — GLUCOSE, CAPILLARY: Glucose-Capillary: 167 mg/dL — ABNORMAL HIGH (ref 65–99)

## 2016-07-03 ENCOUNTER — Encounter (HOSPITAL_COMMUNITY)
Admission: RE | Admit: 2016-07-03 | Discharge: 2016-07-03 | Disposition: A | Discharge status: Home / Self Care | Payer: BLUE CROSS/BLUE SHIELD | Source: Ambulatory Visit | Attending: Internal Medicine | Admitting: Internal Medicine

## 2016-07-03 DIAGNOSIS — Z951 Presence of aortocoronary bypass graft: Secondary | ICD-10-CM | POA: Diagnosis not present

## 2016-07-03 LAB — GLUCOSE, CAPILLARY
GLUCOSE-CAPILLARY: 196 mg/dL — AB (ref 65–99)
Glucose-Capillary: 172 mg/dL — ABNORMAL HIGH (ref 65–99)

## 2016-07-05 ENCOUNTER — Encounter (HOSPITAL_COMMUNITY)
Admission: RE | Admit: 2016-07-05 | Discharge: 2016-07-05 | Disposition: A | Discharge status: Home / Self Care | Payer: BLUE CROSS/BLUE SHIELD | Source: Ambulatory Visit | Attending: Internal Medicine | Admitting: Internal Medicine

## 2016-07-05 DIAGNOSIS — Z951 Presence of aortocoronary bypass graft: Secondary | ICD-10-CM

## 2016-07-05 LAB — GLUCOSE, CAPILLARY: Glucose-Capillary: 183 mg/dL — ABNORMAL HIGH (ref 65–99)

## 2016-07-08 ENCOUNTER — Encounter (HOSPITAL_COMMUNITY)
Admission: RE | Admit: 2016-07-08 | Discharge: 2016-07-08 | Disposition: A | Discharge status: Home / Self Care | Payer: BLUE CROSS/BLUE SHIELD | Source: Ambulatory Visit | Attending: Internal Medicine | Admitting: Internal Medicine

## 2016-07-08 DIAGNOSIS — Z951 Presence of aortocoronary bypass graft: Secondary | ICD-10-CM | POA: Diagnosis not present

## 2016-07-08 LAB — GLUCOSE, CAPILLARY: Glucose-Capillary: 173 mg/dL — ABNORMAL HIGH (ref 65–99)

## 2016-07-10 ENCOUNTER — Encounter (HOSPITAL_COMMUNITY)
Admission: RE | Admit: 2016-07-10 | Discharge: 2016-07-10 | Disposition: A | Discharge status: Home / Self Care | Payer: BLUE CROSS/BLUE SHIELD | Source: Ambulatory Visit | Attending: Internal Medicine | Admitting: Internal Medicine

## 2016-07-10 DIAGNOSIS — Z951 Presence of aortocoronary bypass graft: Secondary | ICD-10-CM

## 2016-07-10 LAB — GLUCOSE, CAPILLARY: Glucose-Capillary: 187 mg/dL — ABNORMAL HIGH (ref 65–99)

## 2016-07-12 ENCOUNTER — Encounter (HOSPITAL_COMMUNITY)
Admission: RE | Admit: 2016-07-12 | Discharge: 2016-07-12 | Disposition: A | Discharge status: Home / Self Care | Payer: BLUE CROSS/BLUE SHIELD | Source: Ambulatory Visit | Attending: Internal Medicine | Admitting: Internal Medicine

## 2016-07-12 DIAGNOSIS — Z951 Presence of aortocoronary bypass graft: Secondary | ICD-10-CM

## 2016-07-12 LAB — GLUCOSE, CAPILLARY: GLUCOSE-CAPILLARY: 191 mg/dL — AB (ref 65–99)

## 2016-07-13 DIAGNOSIS — Z951 Presence of aortocoronary bypass graft: Secondary | ICD-10-CM | POA: Diagnosis not present

## 2016-07-15 ENCOUNTER — Encounter (HOSPITAL_COMMUNITY)
Admission: RE | Admit: 2016-07-15 | Discharge: 2016-07-15 | Disposition: A | Discharge status: Home / Self Care | Payer: BLUE CROSS/BLUE SHIELD | Source: Ambulatory Visit | Attending: Cardiology | Admitting: Cardiology

## 2016-07-15 DIAGNOSIS — Z951 Presence of aortocoronary bypass graft: Secondary | ICD-10-CM | POA: Diagnosis not present

## 2016-07-15 LAB — GLUCOSE, CAPILLARY: GLUCOSE-CAPILLARY: 181 mg/dL — AB (ref 65–99)

## 2016-07-17 ENCOUNTER — Encounter (HOSPITAL_COMMUNITY)
Admission: RE | Admit: 2016-07-17 | Discharge: 2016-07-17 | Disposition: A | Discharge status: Home / Self Care | Payer: BLUE CROSS/BLUE SHIELD | Source: Ambulatory Visit | Attending: Cardiology | Admitting: Cardiology

## 2016-07-17 VITALS — Wt 308.9 lb

## 2016-07-17 DIAGNOSIS — J449 Chronic obstructive pulmonary disease, unspecified: Secondary | ICD-10-CM | POA: Diagnosis not present

## 2016-07-17 DIAGNOSIS — Z79899 Other long term (current) drug therapy: Secondary | ICD-10-CM | POA: Insufficient documentation

## 2016-07-17 DIAGNOSIS — I10 Essential (primary) hypertension: Secondary | ICD-10-CM | POA: Diagnosis not present

## 2016-07-17 DIAGNOSIS — E119 Type 2 diabetes mellitus without complications: Secondary | ICD-10-CM | POA: Insufficient documentation

## 2016-07-17 DIAGNOSIS — K219 Gastro-esophageal reflux disease without esophagitis: Secondary | ICD-10-CM | POA: Insufficient documentation

## 2016-07-17 DIAGNOSIS — Z951 Presence of aortocoronary bypass graft: Secondary | ICD-10-CM

## 2016-07-17 DIAGNOSIS — I2510 Atherosclerotic heart disease of native coronary artery without angina pectoris: Secondary | ICD-10-CM | POA: Diagnosis not present

## 2016-07-17 DIAGNOSIS — Z794 Long term (current) use of insulin: Secondary | ICD-10-CM | POA: Insufficient documentation

## 2016-07-17 DIAGNOSIS — Z7982 Long term (current) use of aspirin: Secondary | ICD-10-CM | POA: Diagnosis not present

## 2016-07-17 LAB — GLUCOSE, CAPILLARY: Glucose-Capillary: 154 mg/dL — ABNORMAL HIGH (ref 65–99)

## 2016-07-17 NOTE — Progress Notes (Signed)
Gavin Lawson 65 y.o. male  Nutrition Note Spoke with pt. Pt c/o 10 lb wt gain since starting Metformin ER. Pt started Metformin ER 06/10/16. Pt rehab wt reviewed. Pt wt from 04/04/16 through 06/05/16 was 134.5-137.4 kg. Pt wt is up since Metformin ER started. Pt wt is up 3.6 kg (7.9 lb) over the past month. Pt denies eating more or any change in eating habits "if anything, I'm eating less." Suspect wt gain due to improved blood glucose absorption with Metformin ER. Pt c/o "My clothes aren't fitting and I have to wear my stretchy pants to church now." Noted pt A1c increased at last check. Pt wants to stop his Metformin due to weight gain. Pt encouraged to continue Metformin ER. Reason behind wt gain discussed. Pt encouraged to discuss wt gain at his appt with Dr. Felipa Furnace tomorrow. Pt expressed understanding of the information reviewed via feedback method. Continue client-centered nutrition education by RD as part of interdisciplinary care.  Monitor and evaluate progress toward nutrition goal with team.  Mickle Plumb, M.Ed, RD, LDN, CDE 07/17/2016 11:30 AM Wt Readings from Last 3 Encounters:  07/17/16 (!) 308 lb 13.8 oz (140.1 kg)  04/04/16 (!) 300 lb 11.3 oz (136.4 kg)  09/29/15 293 lb 3.4 oz (133 kg)

## 2016-07-19 ENCOUNTER — Encounter (HOSPITAL_COMMUNITY): Payer: BLUE CROSS/BLUE SHIELD

## 2016-07-22 ENCOUNTER — Encounter (HOSPITAL_COMMUNITY)
Admission: RE | Admit: 2016-07-22 | Discharge: 2016-07-22 | Disposition: A | Discharge status: Home / Self Care | Payer: BLUE CROSS/BLUE SHIELD | Source: Ambulatory Visit | Attending: Cardiology | Admitting: Cardiology

## 2016-07-22 DIAGNOSIS — Z951 Presence of aortocoronary bypass graft: Secondary | ICD-10-CM | POA: Diagnosis not present

## 2016-07-22 LAB — GLUCOSE, CAPILLARY
GLUCOSE-CAPILLARY: 178 mg/dL — AB (ref 65–99)
GLUCOSE-CAPILLARY: 129 mg/dL — AB (ref 65–99)

## 2016-07-24 ENCOUNTER — Encounter (HOSPITAL_COMMUNITY)
Admission: RE | Admit: 2016-07-24 | Discharge: 2016-07-24 | Disposition: A | Discharge status: Home / Self Care | Payer: BLUE CROSS/BLUE SHIELD | Source: Ambulatory Visit | Attending: Cardiology | Admitting: Cardiology

## 2016-07-24 VITALS — BP 124/62 | HR 78 | Ht 68.5 in | Wt 306.7 lb

## 2016-07-24 DIAGNOSIS — Z951 Presence of aortocoronary bypass graft: Secondary | ICD-10-CM | POA: Diagnosis not present

## 2016-07-24 LAB — GLUCOSE, CAPILLARY: GLUCOSE-CAPILLARY: 168 mg/dL — AB (ref 65–99)

## 2016-07-24 NOTE — Progress Notes (Signed)
Discharge Summary  Patient Details  Name: Gavin Lawson MRN: [id number] Date of Birth: [birth year] Referring Provider:   Flowsheet Row CARDIAC REHAB PHASE II ORIENTATION from 04/04/2016 in [place]  Referring Provider  Curly Rim MD       Number of Visits: 35  Reason for Discharge:  Patient reached a stable level of exercise.  Smoking History:  History  Smoking Status  . Never Smoker  Smokeless Tobacco  . Never Used    Comment: exposed to second hand smoke     Diagnosis:  S/P CABG x 3  ADL UCSD:   Initial Exercise Prescription:     Initial Exercise Prescription - 07/15/16 1700      T5 Nustep   Minutes 30     Resistance Training   Training Prescription Yes   Reps 10-12      Discharge Exercise Prescription (Final Exercise Prescription Changes):     Exercise Prescription Changes - 07/24/16 1600      Response to Exercise   Blood Pressure (Admit) 124/62   Blood Pressure (Exercise) 150/84   Blood Pressure (Exit) 120/68   Heart Rate (Admit) 78 bpm   Heart Rate (Exercise) 91 bpm   Heart Rate (Exit) 71 bpm   Rating of Perceived Exertion (Exercise) 12   Comments Home Exercise Given 04/15/16   Duration Progress to 30 minutes of continuous aerobic without signs/symptoms of physical distress   Intensity THRR unchanged     Progression   Progression Continue to progress workloads to maintain intensity without signs/symptoms of physical distress.   Average METs 2.6     Resistance Training   Training Prescription Yes   Reps 10-12     Interval Training   Interval Training No     T5 Nustep   Level 5   Minutes 30   METs 2.6     Home Exercise Plan   Plans to continue exercise at Home   Frequency Add 2 additional days to program exercise sessions.      Functional Capacity:     6 Minute Walk    Row Name 04/04/16 1351 07/15/16 1736       6 Minute Walk   Phase Initial Discharge    Distance 600 feet 868 feet    Distance % Change  - 44.67 %    Walk Time 5.3 minutes 6 minutes    # of Rest Breaks 2  14 sec and 32 secs 0    MPH 1.2 1.64    METS 0.93 1.27    RPE 11 15    VO2 Peak 3.3 4.43    Symptoms Yes (comment) Yes (comment)    Comments 8/10 back pain 10/10 back and hip pain- chronic    Resting HR 62 bpm 73 bpm    Resting BP 142/72 120/80    Max Ex. HR 81 bpm 84 bpm    Max Ex. BP 146/82 138/70    2 Minute Post BP 132/82  -       Psychological, QOL, Others - Outcomes: PHQ 2/9: Depression screen [facility] 2/9 07/24/2016 04/08/2016  Decreased Interest 0 1  Down, Depressed, Hopeless 0 1  PHQ - 2 Score 0 2  Altered sleeping - 0  Tired, decreased energy - 0  Change in appetite - 0  Feeling bad or failure about yourself  - 1  Trouble concentrating - 1  Moving slowly or fidgety/restless - 0  Suicidal thoughts - 0  PHQ-9 Score - 4  Difficult doing work/chores - Somewhat difficult    Quality of Life:     Quality of Life - 07/24/16 1129      Quality of Life Scores   Health/Function Pre 18.75 %   Health/Function Post 18.07 %   Health/Function % Change -3.63 %   Socioeconomic Pre 16.21 %   Socioeconomic Post 20.58 %   Socioeconomic % Change  26.96 %   Psych/Spiritual Pre 13.71 %   Psych/Spiritual Post 18.93 %   Psych/Spiritual % Change 38.07 %   Family Pre 24.38 %   Family Post 21.8 %   Family % Change -10.58 %   GLOBAL Pre 17.73 %   GLOBAL Post 19.27 %   GLOBAL % Change 8.69 %      Personal Goals: Goals established at orientation with interventions provided to work toward goal.     Personal Goals and Risk Factors at Admission - 04/04/16 0913      Core Components/Risk Factors/Patient Goals on Admission    Weight Management Yes;Obesity   Intervention Weight Management: Develop a combined nutrition and exercise program designed to reach desired caloric intake, while maintaining appropriate intake of nutrient and fiber, sodium and fats, and appropriate energy expenditure required for  the weight goal.;Weight Management: Provide education and appropriate resources to help participant work on and attain dietary goals.;Obesity: Provide education and appropriate resources to help participant work on and attain dietary goals.   Admit Weight 300 lb 11.3 oz (136.4 kg)   Goal Weight: Short Term 295 lb (133.8 kg)   Goal Weight: Long Term 290 lb (131.5 kg)   Expected Outcomes Long Term: Adherence to nutrition and physical activity/exercise program aimed toward attainment of established weight goal;Short Term: Continue to assess and modify interventions until short term weight is achieved;Weight Loss: Understanding of general recommendations for a balanced deficit meal plan, which promotes 1-2 lb weight loss per week and includes a negative energy balance of [phone] kcal/d;Understanding recommendations for meals to include 15-35% energy as protein, 25-35% energy from fat, 35-60% energy from carbohydrates, less than 252m of dietary cholesterol, 20-35 gm of total fiber daily;Understanding of distribution of calorie intake throughout the day with the consumption of 4-5 meals/snacks   Sedentary Yes   Intervention Provide advice, education, support and counseling about physical activity/exercise needs.;Develop an individualized exercise prescription for aerobic and resistive training based on initial evaluation findings, risk stratification, comorbidities and participant's personal goals.   Expected Outcomes Achievement of increased cardiorespiratory fitness and enhanced flexibility, muscular endurance and strength shown through measurements of functional capacity and personal statement of participant.   Increase Strength and Stamina Yes   Intervention Provide advice, education, support and counseling about physical activity/exercise needs.;Develop an individualized exercise prescription for aerobic and resistive training based on initial evaluation findings, risk stratification, comorbidities and  participant's personal goals.   Expected Outcomes Achievement of increased cardiorespiratory fitness and enhanced flexibility, muscular endurance and strength shown through measurements of functional capacity and personal statement of participant.   Improve shortness of breath with ADL's Yes   Intervention Provide education, individualized exercise plan and daily activity instruction to help decrease symptoms of SOB with activities of daily living.   Expected Outcomes Short Term: Achieves a reduction of symptoms when performing activities of daily living.   Diabetes Yes   Intervention Provide education about signs/symptoms and action to take for hypo/hyperglycemia.;Provide education about proper nutrition, including hydration, and aerobic/resistive exercise prescription along with prescribed medications to achieve blood glucose in normal ranges: Fasting  glucose 65-99 mg/dL   Expected Outcomes Short Term: Participant verbalizes understanding of the signs/symptoms and immediate care of hyper/hypoglycemia, proper foot care and importance of medication, aerobic/resistive exercise and nutrition plan for blood glucose control.;Long Term: Attainment of HbA1C < 7%.   Hypertension Yes   Intervention Provide education on lifestyle modifcations including regular physical activity/exercise, weight management, moderate sodium restriction and increased consumption of fresh fruit, vegetables, and low fat dairy, alcohol moderation, and smoking cessation.;Monitor prescription use compliance.   Expected Outcomes Short Term: Continued assessment and intervention until BP is < 140/61m HG in hypertensive participants. < 130/850mHG in hypertensive participants with diabetes, heart failure or chronic kidney disease.;Long Term: Maintenance of blood pressure at goal levels.   Lipids Yes   Intervention Provide education and support for participant on nutrition & aerobic/resistive exercise along with prescribed medications to  achieve LDL <7038mHDL >53m65m Expected Outcomes Long Term: Cholesterol controlled with medications as prescribed, with individualized exercise RX and with personalized nutrition plan. Value goals: LDL < 70mg53mL > 40 mg.;Short Term: Participant states understanding of desired cholesterol values and is compliant with medications prescribed. Participant is following exercise prescription and nutrition guidelines.   Stress Yes   Intervention Offer individual and/or small group education and counseling on adjustment to heart disease, stress management and health-related lifestyle change. Teach and support self-help strategies.   Expected Outcomes Short Term: Participant demonstrates changes in health-related behavior, relaxation and other stress management skills, ability to obtain effective social support, and compliance with psychotropic medications if prescribed.;Long Term: Emotional wellbeing is indicated by absence of clinically significant psychosocial distress or social isolation.   Personal Goal Other Yes   Personal Goal Get better- be able to walk longer with less SOB   Intervention Provide exercise prescription in program and at home toincrease exercise strength and endurance   Expected Outcomes increased exercise tolerance, decreased pain in hip with increased movement        Personal Goals Discharge:     Goals and Risk Factor Review    Row Name 04/15/16 1136 05/01/16 1428 05/27/16 1716 06/28/16 1700 07/24/16 1644     Core Components/Risk Factors/Patient Goals Review   Personal Goals Review Weight Management/Obesity;Diabetes Weight Management/Obesity;Diabetes;Other Sedentary;Other Improve shortness of breath with ADL's;Other Other   Review see 04/15/16 RD note. Pt in the contemplative state of change re: wt loss and the active state of change re: DM control see 04/15/16 RD note. Pt in the contemplative state of change re: wt loss and the active state of change re: DM control Encouraged  patient to walk at home and will continue to progress workloads at cardiac reahb as tolerated. Pt states he's walking at work. Pt feels he's less short of breath when walking distances. Encouraged patient to try to accumulate 30 minutes of walking at least 3 days/week. Exercise limited by orthopedic issues.   Expected Outcomes 0.5-2 lb wt loss/week and improved CBG control as evidenced by A1c decreasing toward goal of less than 7. 0.5-2 lb wt loss/week and improved CBG control as evidenced by A1c decreasing toward goal of less than 7. Exercise consistently at cardiac rehab and at home. Exercise consistently at cardiac rehab and at home. Consistently walk 30 minutes, 3 days/week to to increase stamina and cardiorespiraory endurance.      Nutrition & Weight - Outcomes:     Pre Biometrics - 04/04/16 1416      Pre Biometrics   Waist Circumference 54 inches   Hip Circumference  53 inches   Waist to Hip Ratio 1.02 %   Triceps Skinfold 34 mm   % Body Fat 44 %   Grip Strength 37 kg   Flexibility 0 in   Single Leg Stand 0 seconds         Post Biometrics - 07/24/16 1647       Post  Biometrics   Height 5' 8.5" (1.74 m)   Weight (!)  306 lb 10.6 oz (139.1 kg)   Waist Circumference 55.25 inches   Hip Circumference 55 inches   Waist to Hip Ratio 1 %   BMI (Calculated) 46   Triceps Skinfold 40 mm   % Body Fat 44.3 %   Grip Strength 40.5 kg   Flexibility 0 in   Single Leg Stand 0 seconds      Nutrition:     Nutrition Therapy & Goals - 04/04/16 1025      Nutrition Therapy   Diet Diabetic, Therapeutic Lifestyle Changes     Personal Nutrition Goals   Personal Goal #1 Wt loss of 0.5-2 lb/week to a goal wt loss of 6-24 lb at graduation from [place] Goal #2 Improve blood glucose control as evidenced by a decrease in HbgA1c from 9.5 09/28/15 toward the desired goal of < 7.0     Intervention Plan   Intervention Prescribe, educate and counsel regarding individualized  specific dietary modifications aiming towards targeted core components such as weight, hypertension, lipid management, diabetes, heart failure and other comorbidities.   Expected Outcomes Short Term Goal: Understand basic principles of dietary content, such as calories, fat, sodium, cholesterol and nutrients.;Long Term Goal: Adherence to prescribed nutrition plan.      Nutrition Discharge:     Nutrition Assessments - 08/16/16 1031      MEDFICTS Scores   Pre Score 66   Post Score 65   Score Difference -1      Education Questionnaire Score:     Knowledge Questionnaire Score - 07/24/16 1129      Knowledge Questionnaire Score   Pre Score 21/24               DM 13/15   Post Score 19/24      Goals reviewed with patient; copy given to patient.Pt graduated from cardiac rehab program today with completion of 35 exercise sessions in Phase II. Pt maintained good attendance and progressed nicely during his participation in rehab as evidenced by increased MET level.   Medication list reconciled. Repeat  PHQ score-  .0  Pt has made some lifestyle changes and should be commended for his success. Pt feels he has achieved his goals during cardiac rehab.   Pt encouraged to continue exercise on his own.Barnet Pall, RN,BSN 08/20/2016 10:28 AM

## 2016-07-26 ENCOUNTER — Encounter (HOSPITAL_COMMUNITY): Admission: RE | Admit: 2016-07-26 | Payer: BLUE CROSS/BLUE SHIELD | Source: Ambulatory Visit

## 2016-08-16 NOTE — Addendum Note (Signed)
Encounter addended by: Jacques EarthlyEdna Brewbaker Jaymi Tinner, RD on: 08/16/2016 10:32 AM<BR>    Actions taken: Flowsheet data copied forward, Visit Navigator Flowsheet section accepted

## 2017-11-03 ENCOUNTER — Encounter (HOSPITAL_COMMUNITY): Payer: Self-pay

## 2017-11-03 ENCOUNTER — Emergency Department (HOSPITAL_COMMUNITY): Payer: Medicare Other

## 2017-11-03 ENCOUNTER — Emergency Department (HOSPITAL_COMMUNITY)
Admission: EM | Admit: 2017-11-03 | Discharge: 2017-11-03 | Disposition: A | Discharge status: Home / Self Care | Payer: Medicare Other | Attending: Emergency Medicine | Admitting: Emergency Medicine

## 2017-11-03 ENCOUNTER — Other Ambulatory Visit: Payer: Self-pay

## 2017-11-03 DIAGNOSIS — Z5321 Procedure and treatment not carried out due to patient leaving prior to being seen by health care provider: Secondary | ICD-10-CM | POA: Insufficient documentation

## 2017-11-03 DIAGNOSIS — R030 Elevated blood-pressure reading, without diagnosis of hypertension: Secondary | ICD-10-CM | POA: Insufficient documentation

## 2017-11-03 DIAGNOSIS — R51 Headache: Secondary | ICD-10-CM | POA: Insufficient documentation

## 2017-11-03 LAB — CBC WITH DIFFERENTIAL/PLATELET
BASOS ABS: 0 10*3/uL (ref 0.0–0.1)
BASOS PCT: 1 %
EOS ABS: 0.2 10*3/uL (ref 0.0–0.7)
Eosinophils Relative: 3 %
HCT: 35.3 % — ABNORMAL LOW (ref 39.0–52.0)
HEMOGLOBIN: 11.2 g/dL — AB (ref 13.0–17.0)
Lymphocytes Relative: 24 %
Lymphs Abs: 1.5 10*3/uL (ref 0.7–4.0)
MCH: 29.1 pg (ref 26.0–34.0)
MCHC: 31.7 g/dL (ref 30.0–36.0)
MCV: 91.7 fL (ref 78.0–100.0)
MONOS PCT: 6 %
Monocytes Absolute: 0.4 10*3/uL (ref 0.1–1.0)
NEUTROS PCT: 66 %
Neutro Abs: 4 10*3/uL (ref 1.7–7.7)
Platelets: 211 10*3/uL (ref 150–400)
RBC: 3.85 MIL/uL — ABNORMAL LOW (ref 4.22–5.81)
RDW: 13.7 % (ref 11.5–15.5)
WBC: 6.1 10*3/uL (ref 4.0–10.5)

## 2017-11-03 LAB — COMPREHENSIVE METABOLIC PANEL
ALK PHOS: 34 U/L — AB (ref 38–126)
ALT: 11 U/L — AB (ref 17–63)
ANION GAP: 7 (ref 5–15)
AST: 16 U/L (ref 15–41)
Albumin: 3.5 g/dL (ref 3.5–5.0)
BILIRUBIN TOTAL: 0.5 mg/dL (ref 0.3–1.2)
BUN: 17 mg/dL (ref 6–20)
CALCIUM: 8.5 mg/dL — AB (ref 8.9–10.3)
CO2: 26 mmol/L (ref 22–32)
CREATININE: 1.03 mg/dL (ref 0.61–1.24)
Chloride: 105 mmol/L (ref 101–111)
Glucose, Bld: 193 mg/dL — ABNORMAL HIGH (ref 65–99)
Potassium: 4.5 mmol/L (ref 3.5–5.1)
Sodium: 138 mmol/L (ref 135–145)
TOTAL PROTEIN: 6 g/dL — AB (ref 6.5–8.1)

## 2017-11-03 NOTE — ED Triage Notes (Signed)
Pt states he had a cataract procedure done last week. He states his BP is normally normal. Pt states he has had high blood pressure since then. Highest readying 220 systolic. Pt alert and oriented. Pt also reports having headache and blurred vision.

## 2018-11-18 ENCOUNTER — Emergency Department (HOSPITAL_COMMUNITY)
Admission: EM | Admit: 2018-11-18 | Discharge: 2018-11-19 | Disposition: A | Discharge status: Home / Self Care | Payer: Medicare Other | Attending: Emergency Medicine | Admitting: Emergency Medicine

## 2018-11-18 ENCOUNTER — Emergency Department (HOSPITAL_COMMUNITY): Payer: Medicare Other

## 2018-11-18 ENCOUNTER — Encounter (HOSPITAL_COMMUNITY): Payer: Self-pay

## 2018-11-18 DIAGNOSIS — I2510 Atherosclerotic heart disease of native coronary artery without angina pectoris: Secondary | ICD-10-CM | POA: Diagnosis not present

## 2018-11-18 DIAGNOSIS — Z79899 Other long term (current) drug therapy: Secondary | ICD-10-CM | POA: Diagnosis not present

## 2018-11-18 DIAGNOSIS — Z951 Presence of aortocoronary bypass graft: Secondary | ICD-10-CM | POA: Diagnosis not present

## 2018-11-18 DIAGNOSIS — G8929 Other chronic pain: Secondary | ICD-10-CM | POA: Diagnosis not present

## 2018-11-18 DIAGNOSIS — Z794 Long term (current) use of insulin: Secondary | ICD-10-CM | POA: Insufficient documentation

## 2018-11-18 DIAGNOSIS — J449 Chronic obstructive pulmonary disease, unspecified: Secondary | ICD-10-CM | POA: Insufficient documentation

## 2018-11-18 DIAGNOSIS — R05 Cough: Secondary | ICD-10-CM | POA: Diagnosis not present

## 2018-11-18 DIAGNOSIS — R600 Localized edema: Secondary | ICD-10-CM | POA: Insufficient documentation

## 2018-11-18 DIAGNOSIS — E119 Type 2 diabetes mellitus without complications: Secondary | ICD-10-CM | POA: Insufficient documentation

## 2018-11-18 DIAGNOSIS — I10 Essential (primary) hypertension: Secondary | ICD-10-CM | POA: Diagnosis not present

## 2018-11-18 DIAGNOSIS — Z7982 Long term (current) use of aspirin: Secondary | ICD-10-CM | POA: Diagnosis not present

## 2018-11-18 DIAGNOSIS — R0602 Shortness of breath: Secondary | ICD-10-CM | POA: Insufficient documentation

## 2018-11-18 LAB — CBC
HCT: 34.8 % — ABNORMAL LOW (ref 39.0–52.0)
Hemoglobin: 11.2 g/dL — ABNORMAL LOW (ref 13.0–17.0)
MCH: 29.7 pg (ref 26.0–34.0)
MCHC: 32.2 g/dL (ref 30.0–36.0)
MCV: 92.3 fL (ref 80.0–100.0)
NRBC: 0 % (ref 0.0–0.2)
PLATELETS: 165 10*3/uL (ref 150–400)
RBC: 3.77 MIL/uL — AB (ref 4.22–5.81)
RDW: 12.6 % (ref 11.5–15.5)
WBC: 4.4 10*3/uL (ref 4.0–10.5)

## 2018-11-18 LAB — TROPONIN I

## 2018-11-18 LAB — BASIC METABOLIC PANEL
Anion gap: 8 (ref 5–15)
BUN: 16 mg/dL (ref 8–23)
CO2: 33 mmol/L — AB (ref 22–32)
CREATININE: 1.26 mg/dL — AB (ref 0.61–1.24)
Calcium: 8.5 mg/dL — ABNORMAL LOW (ref 8.9–10.3)
Chloride: 99 mmol/L (ref 98–111)
GFR calc non Af Amer: 59 mL/min — ABNORMAL LOW (ref >60)
Glucose, Bld: 161 mg/dL — ABNORMAL HIGH (ref 70–99)
Potassium: 4.5 mmol/L (ref 3.5–5.1)
SODIUM: 140 mmol/L (ref 135–145)

## 2018-11-18 LAB — BRAIN NATRIURETIC PEPTIDE: B NATRIURETIC PEPTIDE 5: 184.7 pg/mL — AB (ref 0.0–100.0)

## 2018-11-18 MED ORDER — METHYLPREDNISOLONE SODIUM SUCC 125 MG IJ SOLR
125.0000 mg | Freq: Once | INTRAMUSCULAR | Status: AC
  Administered 2018-11-18: 125 mg via INTRAVENOUS
  Filled 2018-11-18: qty 2

## 2018-11-18 MED ORDER — IPRATROPIUM-ALBUTEROL 0.5-2.5 (3) MG/3ML IN SOLN
3.0000 mL | Freq: Once | RESPIRATORY_TRACT | Status: AC
  Administered 2018-11-18: 3 mL via RESPIRATORY_TRACT
  Filled 2018-11-18: qty 3

## 2018-11-18 NOTE — ED Provider Notes (Signed)
[facility] EMERGENCY DEPARTMENT Provider Note   CSN: [id number] Arrival date & time: 11/18/18  1943     History   Chief Complaint Chief Complaint  Patient presents with  . Shortness of Breath    HPI Gavin Lawson is a 67 y.o. male with history of COPD, CAD status post CABG x3, diabetes mellitus, GERD, headache, hypertension, and chronic back pain presents for evaluation of gradual onset, aggressively worsening flulike symptoms and shortness of breath for 1 week.  He reports nasal congestion, sinus pressure, cough productive of clear-yellow sputum, subjective fevers and chills.  He does report progressively worsening dyspnea on exertion and shortness of breath at rest.  Denies chest pain, abdominal pain, nausea, or vomiting.  He also notes a wound to the anterior aspect of the right shin after scraping his right shin against his wheelchair. Notes chronic and unchanged bilateral lower extremity edema and erythema. Denies abnormal drainage, or worsening surrounding erythema.  No recent travel or surgeries, no hemoptysis, no prior history of DVT or PE.  He has tried Alka-Seltzer with some improvement in his symptoms and NyQuil with no relief.  The history is provided by the patient.    Past Medical History:  Diagnosis Date  . Arthritis   . Back pain   . COPD (chronic obstructive pulmonary disease) (HCC)   . Coronary artery disease 01/02/16   CABGx3 [place]  . Depression   . Diabetes mellitus    INSULIN DEPENDENT  . GERD (gastroesophageal reflux disease)   . Headache   . Hypertension   . Sciatic nerve pain   . Shortness of breath dyspnea     Patient Active Problem List   Diagnosis Date Noted  . Dyspnea 09/28/2015  . Hypertension 09/28/2015  . OSA (obstructive sleep apnea) 09/28/2015  . Type 2 diabetes mellitus (HCC) 09/28/2015  . GERD (gastroesophageal reflux disease) 09/28/2015  . Dysphagia 09/28/2015  . History of DVT (deep vein thrombosis) 09/28/2015  .  Chronic low back pain 09/28/2015    Past Surgical History:  Procedure Laterality Date  . CORONARY ARTERY BYPASS GRAFT  01/02/2016   [name], Dr.Kincaid, SVG on pump  . CYST EXCISION    . KNEE ARTHROSCOPY Bilateral   . TONSILLECTOMY          Home Medications    Prior to Admission medications   Medication Sig Start Date End Date Taking? Authorizing Provider  acetaminophen (TYLENOL) 500 MG tablet Take 1,000 mg by mouth 3 (three) times daily.     [provider]  aspirin 81 MG chewable tablet Chew 162 mg by mouth daily.    [provider]  benzonatate (TESSALON) 100 MG capsule Take 1 capsule (100 mg total) by mouth 3 (three) times daily as needed for cough. 11/19/18   Romeka Scifres A, PA-C  carvedilol (COREG) 12.5 MG tablet Take 12.5 mg by mouth 2 (two) times daily with a meal.    [provider]  furosemide (LASIX) 40 MG tablet Take 1 tablet (40 mg total) by mouth daily. 09/29/15   Jana Half, MD  insulin glargine (LANTUS) 100 UNIT/ML injection Inject 28 Units into the skin daily.    [provider]  lisinopril (PRINIVIL,ZESTRIL) 5 MG tablet Take 5 mg by mouth 2 (two) times daily.     [provider]  metFORMIN (GLUCOPHAGE-XR) 500 MG 24 hr tablet Take 500 mg by mouth daily with supper. Take 3 tablets with dinner daily 06/10/16   [facility], [facility]  oxyCODONE (OXY IR/ROXICODONE) 5 MG immediate release tablet Take 5 mg by mouth 2 (two) times daily as needed for severe pain.    [provider]  pantoprazole (PROTONIX) 40 MG tablet Take 40 mg by mouth 2 (two) times daily.     [provider]  pravastatin (PRAVACHOL) 40 MG tablet Take 40 mg by mouth daily.    [provider]  predniSONE (DELTASONE) 10 MG tablet Take 4 tablets (40 mg total) by mouth daily with breakfast for 3 days. 11/19/18 11/22/18  Michela Pitcher A, PA-C  sennosides-docusate sodium (SENOKOT-S) 8.6-50 MG tablet Take 2 tablets by mouth at  bedtime.    [provider]  traMADol (ULTRAM) 50 MG tablet Take 50 mg by mouth 2 (two) times daily as needed. Reported on 04/08/2016    [provider]    Family History Family History  Problem Relation Age of Onset  . Diabetes Mother   . Hypertension Mother   . Diabetes Father   . Hypertension Father   . Hypertension Sister   . Hypertension Brother     Social History Social History   Tobacco Use  . Smoking status: Never Smoker  . Smokeless tobacco: Never Used  . Tobacco comment: exposed to second hand smoke   Substance Use Topics  . Alcohol use: Yes    Comment: occ  . Drug use: No     Allergies   Bee venom; Codeine; Other; and Penicillins   Review of Systems Review of Systems  Constitutional: Positive for chills and fever.  HENT: Positive for congestion and sinus pressure. Negative for ear discharge and sore throat.   Respiratory: Positive for cough and shortness of breath.   Cardiovascular: Positive for leg swelling. Negative for chest pain.  Gastrointestinal: Negative for abdominal pain, nausea and vomiting.  Skin: Positive for wound.  All other systems reviewed and are negative.    Physical Exam Updated Vital Signs BP (!) 170/88 (BP Location: Left Arm)   Pulse 71   Temp 98.1 F (36.7 C) (Oral)   Resp 20   SpO2 98%   Physical Exam  Constitutional: He appears well-developed and well-nourished. No distress.  HENT:  Head: Normocephalic and atraumatic.  Right Ear: Tympanic membrane and ear canal normal.  Left Ear: Tympanic membrane and ear canal normal.  Nose: Mucosal edema present. Right sinus exhibits no maxillary sinus tenderness and no frontal sinus tenderness. Left sinus exhibits no maxillary sinus tenderness and no frontal sinus tenderness.  Mouth/Throat: Uvula is midline. Mucous membranes are dry. No trismus in the jaw. No posterior oropharyngeal edema or posterior oropharyngeal erythema. Tonsils are 1+ on the right. Tonsils are 1+  on the left. No tonsillar exudate.  Eyes: Conjunctivae are normal. Right eye exhibits no discharge. Left eye exhibits no discharge.  Neck: No JVD present. No tracheal deviation present.  Cardiovascular: Normal rate, regular rhythm and intact distal pulses.  2+ radial and DP/PT pulses bilaterally.  2+ pitting edema of the bilateral lower extremities which patient reports is chronic and unchanged.  Patient has a wound to the anterior aspect of the right lower leg.  There is surrounding erythema but patient reports this is chronic and unchanged.  No active drainage.  Pulmonary/Chest: Effort normal. He has wheezes. He exhibits no tenderness.  Diffuse expiratory wheezing, frequent dry cough.  Patient speaking in full sentences without difficulty.  Well-healed midline sternotomy scar  Abdominal: Soft. Bowel sounds are normal. He exhibits no distension. There is no tenderness.  Musculoskeletal:  Right lower leg: He exhibits edema.       Left lower leg: He exhibits edema.  Neurological: He is alert.  Skin: Skin is warm and dry. No erythema.  Psychiatric: He has a normal mood and affect. His behavior is normal.  Nursing note and vitals reviewed.    ED Treatments / Results  Labs (all labs ordered are listed, but only abnormal results are displayed) Labs Reviewed  CBC - Abnormal; Notable for the following components:      Result Value   RBC 3.77 (*)    Hemoglobin 11.2 (*)    HCT 34.8 (*)    All other components within normal limits  BRAIN NATRIURETIC PEPTIDE - Abnormal; Notable for the following components:   B Natriuretic Peptide 184.7 (*)    All other components within normal limits  BASIC METABOLIC PANEL - Abnormal; Notable for the following components:   CO2 33 (*)    Glucose, Bld 161 (*)    Creatinine, Ser 1.26 (*)    Calcium 8.5 (*)    GFR calc non Af Amer 59 (*)    All other components within normal limits  TROPONIN I    EKG EKG Interpretation  Date/Time:  Wednesday  November 18 2018 19:56:32 EST Ventricular Rate:  69 PR Interval:  218 QRS Duration: 102 QT Interval:  410 QTC Calculation: 439 R Axis:   134 Text Interpretation:  Sinus rhythm with 1st degree A-V block Low voltage QRS Left posterior fascicular block Cannot rule out Anteroseptal infarct , age undetermined Abnormal ECG No acute changes Nonspecific ST and T wave abnormality No significant change since last tracing Confirmed by Derwood Kaplan [id number]) on 11/18/2018 11:24:53 PM   Radiology Dg Chest 2 View  Result Date: 11/18/2018 CLINICAL DATA:  Dyspnea since Sunday EXAM: CHEST - 2 VIEW COMPARISON:  11/03/2017 FINDINGS: Stable cardiomegaly with minimal aortic atherosclerosis. Median sternotomy sutures are in place. Lungs are clear. No acute osseous abnormality. Mild osteoarthritic joint space narrowing of the glenohumeral and included left acromioclavicular joint. Mild degenerative change along the dorsal spine. IMPRESSION: Stable cardiomegaly without active pulmonary disease. Electronically Signed   By: Tollie Eth M.D.   On: 11/18/2018 21:41    Procedures Procedures (including critical care time)  Medications Ordered in ED Medications  albuterol (PROVENTIL HFA;VENTOLIN HFA) 108 (90 Base) MCG/ACT inhaler 1 puff (has no administration in time range)  ipratropium-albuterol (DUONEB) 0.5-2.5 (3) MG/3ML nebulizer solution 3 mL (3 mLs Nebulization Given 11/18/18 2220)  methylPREDNISolone sodium succinate (SOLU-MEDROL) 125 mg/2 mL injection 125 mg (125 mg Intravenous Given 11/18/18 2230)     Initial Impression / Assessment and Plan / ED Course  I have reviewed the triage vital signs and the nursing notes.  Pertinent labs & imaging results that were available during my care of the patient were reviewed by me and considered in my medical decision making (see chart for details).     Patient with history of COPD presenting with complaint of URI symptoms and worsening shortness of breath.  He is  afebrile, hypertensive in the ED but has been taking over-the-counter medications which may contain something like pseudoephedrine which could increase his blood pressure.  He is speaking in full sentences without difficulty.  He has diffuse expiratory wheezing which improved with a breathing treatment and IV steroids.  Chest x-ray shows stable cardiomegaly without active cardia pulmonary disease.  His EKG shows no significant changes since last tracing.  BNP mildly elevated, though patient reports his lower extremity edema  is chronic and unchanged.  He does have a wound to the anterior aspect of the right lower leg, though no signs of active infection or underlying abscess.  Troponin ordered in triage is negative, patient is not complaining of chest pain.  Doubt ACS/MI or PE.  Symptoms appear more consistent with URI/infectious etiology.  No evidence of pneumonia.  Remainder of lab work reviewed by me shows mild anemia, mild renal insufficiency.  Encourage patient to stay hydrated appropriately.  Patient reports he is feeling better and would like to go home.  I think this is reasonable as his SPO2 saturations have been stable and he has an appointment scheduled with his PCP for tomorrow.  Will discharge with albuterol inhaler and steroid burst.  Advised to monitor his blood sugars while taking steroid, though his sugars are overall well controlled. Discussed strict ED return precautions.  Patient and friend verbalized understanding of and agreement with plan and patient is stable for discharge home at this time.  Patient was seen and evaluated by Dr. Rhunette CroftNanavati who agrees with assessment and plan at this time.  Final Clinical Impressions(s) / ED Diagnoses   Final diagnoses:  SOB (shortness of breath)    ED Discharge Orders         Ordered    predniSONE (DELTASONE) 10 MG tablet  Daily with breakfast     11/19/18 0006    benzonatate (TESSALON) 100 MG capsule  3 times daily PRN     11/19/18 0006             Jeanie SewerFawze, [place] Santilli A, PA-C 11/19/18 0019    Derwood KaplanNanavati, Ankit, MD 11/19/18 [id number]

## 2018-11-18 NOTE — ED Triage Notes (Signed)
Pt states that he has been having SOB for the past two hours, cough runny nose, coughing up white mucous , hx of CHF, swelling in legs and abdomen.

## 2018-11-18 NOTE — ED Notes (Signed)
Please note: in addition to light green, lavender, and blue tops collected, green top was also collected and on rocker in [facility].

## 2018-11-19 DIAGNOSIS — R0602 Shortness of breath: Secondary | ICD-10-CM | POA: Diagnosis not present

## 2018-11-19 MED ORDER — PREDNISONE 10 MG PO TABS: 40.0000 mg | ORAL_TABLET | Freq: Every day | ORAL | 0 refills | Status: AC

## 2018-11-19 MED ORDER — BENZONATATE 100 MG PO CAPS: 100.0000 mg | ORAL_CAPSULE | Freq: Three times a day (TID) | ORAL | 0 refills | Status: DC | PRN

## 2018-11-19 MED ORDER — ALBUTEROL SULFATE HFA 108 (90 BASE) MCG/ACT IN AERS
1.0000 | INHALATION_SPRAY | Freq: Once | RESPIRATORY_TRACT | Status: AC
  Administered 2018-11-19: 1 via RESPIRATORY_TRACT
  Filled 2018-11-19: qty 6.7

## 2018-11-19 NOTE — Discharge Instructions (Addendum)
Start taking prednisone as prescribed beginning tomorrow.  You received the first dose in the emergency department today.  Check your blood sugars while taking this medicine as it may make your blood sugars increase.  If your blood sugar significantly increased while taking the prednisone, stop taking and call your primary care doctor.  Use the albuterol inhaler 1 to 2 puffs every 4-6 hours as needed for shortness of breath.  Start taking Tessalon as needed for cough.  Avoid Sudafed or over-the-counter medicines containing dextromethorphan because this can cause your blood pressure to worsen.  Drink plenty of water and get plenty of rest.  Follow-up with your PCP as scheduled tomorrow or reschedule in the next 2 to 3 days for reevaluation.  Return to the emergency department immediately for any concerning signs or symptoms develop such as worsening shortness of breath, high fevers, persistent vomiting, or chest pain  If your blood pressure (BP) was elevated on multiple readings during this visit above 130 for the top number or above 80 for the bottom number, please have this repeated by your primary care provider within one month. You can also check your blood pressure when you are out at a pharmacy or grocery store. Many have machines that will check your blood pressure.  If your blood pressure remains elevated, please follow-up with your PCP.

## 2020-03-23 ENCOUNTER — Observation Stay (HOSPITAL_COMMUNITY)
Admission: EM | Admit: 2020-03-23 | Discharge: 2020-03-25 | Disposition: A | Discharge status: Home / Self Care | Payer: Medicare Other | Attending: Internal Medicine | Admitting: Internal Medicine

## 2020-03-23 ENCOUNTER — Encounter (HOSPITAL_COMMUNITY): Payer: Self-pay | Admitting: Emergency Medicine

## 2020-03-23 ENCOUNTER — Other Ambulatory Visit: Payer: Self-pay

## 2020-03-23 DIAGNOSIS — K219 Gastro-esophageal reflux disease without esophagitis: Secondary | ICD-10-CM | POA: Diagnosis not present

## 2020-03-23 DIAGNOSIS — Z79899 Other long term (current) drug therapy: Secondary | ICD-10-CM | POA: Diagnosis not present

## 2020-03-23 DIAGNOSIS — I5032 Chronic diastolic (congestive) heart failure: Secondary | ICD-10-CM | POA: Diagnosis present

## 2020-03-23 DIAGNOSIS — M25559 Pain in unspecified hip: Secondary | ICD-10-CM | POA: Insufficient documentation

## 2020-03-23 DIAGNOSIS — R112 Nausea with vomiting, unspecified: Secondary | ICD-10-CM | POA: Diagnosis not present

## 2020-03-23 DIAGNOSIS — Z951 Presence of aortocoronary bypass graft: Secondary | ICD-10-CM | POA: Insufficient documentation

## 2020-03-23 DIAGNOSIS — G4733 Obstructive sleep apnea (adult) (pediatric): Secondary | ICD-10-CM | POA: Insufficient documentation

## 2020-03-23 DIAGNOSIS — N179 Acute kidney failure, unspecified: Secondary | ICD-10-CM | POA: Diagnosis present

## 2020-03-23 DIAGNOSIS — M1990 Unspecified osteoarthritis, unspecified site: Secondary | ICD-10-CM | POA: Diagnosis not present

## 2020-03-23 DIAGNOSIS — Z885 Allergy status to narcotic agent status: Secondary | ICD-10-CM | POA: Insufficient documentation

## 2020-03-23 DIAGNOSIS — Z20822 Contact with and (suspected) exposure to covid-19: Secondary | ICD-10-CM | POA: Insufficient documentation

## 2020-03-23 DIAGNOSIS — Z833 Family history of diabetes mellitus: Secondary | ICD-10-CM | POA: Insufficient documentation

## 2020-03-23 DIAGNOSIS — Z794 Long term (current) use of insulin: Secondary | ICD-10-CM

## 2020-03-23 DIAGNOSIS — T402X1A Poisoning by other opioids, accidental (unintentional), initial encounter: Principal | ICD-10-CM | POA: Insufficient documentation

## 2020-03-23 DIAGNOSIS — N1831 Chronic kidney disease, stage 3a: Secondary | ICD-10-CM | POA: Insufficient documentation

## 2020-03-23 DIAGNOSIS — J449 Chronic obstructive pulmonary disease, unspecified: Secondary | ICD-10-CM | POA: Insufficient documentation

## 2020-03-23 DIAGNOSIS — E875 Hyperkalemia: Secondary | ICD-10-CM | POA: Diagnosis not present

## 2020-03-23 DIAGNOSIS — Z880 Allergy status to penicillin: Secondary | ICD-10-CM | POA: Insufficient documentation

## 2020-03-23 DIAGNOSIS — G8929 Other chronic pain: Secondary | ICD-10-CM | POA: Diagnosis present

## 2020-03-23 DIAGNOSIS — I130 Hypertensive heart and chronic kidney disease with heart failure and stage 1 through stage 4 chronic kidney disease, or unspecified chronic kidney disease: Secondary | ICD-10-CM | POA: Insufficient documentation

## 2020-03-23 DIAGNOSIS — I2510 Atherosclerotic heart disease of native coronary artery without angina pectoris: Secondary | ICD-10-CM | POA: Diagnosis present

## 2020-03-23 DIAGNOSIS — F329 Major depressive disorder, single episode, unspecified: Secondary | ICD-10-CM | POA: Insufficient documentation

## 2020-03-23 DIAGNOSIS — I10 Essential (primary) hypertension: Secondary | ICD-10-CM | POA: Diagnosis present

## 2020-03-23 DIAGNOSIS — T391X1A Poisoning by 4-Aminophenol derivatives, accidental (unintentional), initial encounter: Secondary | ICD-10-CM | POA: Insufficient documentation

## 2020-03-23 DIAGNOSIS — Z6841 Body Mass Index (BMI) 40.0 and over, adult: Secondary | ICD-10-CM | POA: Insufficient documentation

## 2020-03-23 DIAGNOSIS — I152 Hypertension secondary to endocrine disorders: Secondary | ICD-10-CM | POA: Diagnosis present

## 2020-03-23 DIAGNOSIS — T50901A Poisoning by unspecified drugs, medicaments and biological substances, accidental (unintentional), initial encounter: Secondary | ICD-10-CM | POA: Diagnosis present

## 2020-03-23 DIAGNOSIS — E1122 Type 2 diabetes mellitus with diabetic chronic kidney disease: Secondary | ICD-10-CM | POA: Diagnosis not present

## 2020-03-23 DIAGNOSIS — N183 Chronic kidney disease, stage 3 unspecified: Secondary | ICD-10-CM | POA: Diagnosis present

## 2020-03-23 DIAGNOSIS — E119 Type 2 diabetes mellitus without complications: Secondary | ICD-10-CM

## 2020-03-23 DIAGNOSIS — M545 Low back pain, unspecified: Secondary | ICD-10-CM | POA: Diagnosis present

## 2020-03-23 DIAGNOSIS — E1159 Type 2 diabetes mellitus with other circulatory complications: Secondary | ICD-10-CM | POA: Diagnosis present

## 2020-03-23 DIAGNOSIS — E118 Type 2 diabetes mellitus with unspecified complications: Secondary | ICD-10-CM

## 2020-03-23 LAB — COMPREHENSIVE METABOLIC PANEL
ALT: 13 U/L (ref 0–44)
AST: 14 U/L — ABNORMAL LOW (ref 15–41)
Albumin: 4 g/dL (ref 3.5–5.0)
Alkaline Phosphatase: 53 U/L (ref 38–126)
Anion gap: 9 (ref 5–15)
BUN: 28 mg/dL — ABNORMAL HIGH (ref 8–23)
CO2: 21 mmol/L — ABNORMAL LOW (ref 22–32)
Calcium: 8.8 mg/dL — ABNORMAL LOW (ref 8.9–10.3)
Chloride: 107 mmol/L (ref 98–111)
Creatinine, Ser: 1.94 mg/dL — ABNORMAL HIGH (ref 0.61–1.24)
GFR calc Af Amer: 40 mL/min — ABNORMAL LOW (ref >60)
GFR calc non Af Amer: 35 mL/min — ABNORMAL LOW (ref >60)
Glucose, Bld: 160 mg/dL — ABNORMAL HIGH (ref 70–99)
Potassium: 6 mmol/L — ABNORMAL HIGH (ref 3.5–5.1)
Sodium: 137 mmol/L (ref 135–145)
Total Bilirubin: 0.6 mg/dL (ref 0.3–1.2)
Total Protein: 7.2 g/dL (ref 6.5–8.1)

## 2020-03-23 LAB — POTASSIUM: Potassium: 6 mmol/L — ABNORMAL HIGH (ref 3.5–5.1)

## 2020-03-23 LAB — CBC
HCT: 38.5 % — ABNORMAL LOW (ref 39.0–52.0)
Hemoglobin: 12.3 g/dL — ABNORMAL LOW (ref 13.0–17.0)
MCH: 31.4 pg (ref 26.0–34.0)
MCHC: 31.9 g/dL (ref 30.0–36.0)
MCV: 98.2 fL (ref 80.0–100.0)
Platelets: 280 10*3/uL (ref 150–400)
RBC: 3.92 MIL/uL — ABNORMAL LOW (ref 4.22–5.81)
RDW: 12.3 % (ref 11.5–15.5)
WBC: 9.9 10*3/uL (ref 4.0–10.5)
nRBC: 0 % (ref 0.0–0.2)

## 2020-03-23 LAB — ACETAMINOPHEN LEVEL: Acetaminophen (Tylenol), Serum: 24 ug/mL (ref 10–30)

## 2020-03-23 MED ORDER — SODIUM CHLORIDE 0.9 % IV SOLN: Freq: Once | INTRAVENOUS | Status: AC

## 2020-03-23 MED ORDER — SODIUM ZIRCONIUM CYCLOSILICATE 10 G PO PACK
10.0000 g | PACK | Freq: Once | ORAL | Status: AC
  Administered 2020-03-23: 10 g via ORAL
  Filled 2020-03-23: qty 1

## 2020-03-23 MED ORDER — SODIUM CHLORIDE 0.9 % IV BOLUS
500.0000 mL | Freq: Once | INTRAVENOUS | Status: AC
  Administered 2020-03-23: 500 mL via INTRAVENOUS

## 2020-03-23 NOTE — ED Notes (Signed)
Pt had 1 episode of emesis. 

## 2020-03-23 NOTE — ED Notes (Signed)
Pt lying in bed awake and talking, but states he falls asleep easily. Full monitor on. Will continue to monitor pt.

## 2020-03-23 NOTE — ED Notes (Signed)
Pt has 2nd episode of emesis when arriving to room 9.

## 2020-03-23 NOTE — ED Provider Notes (Addendum)
[name] Provider Note   CSN: [id number] Arrival date & time: 03/23/20  1653     History Chief Complaint  Patient presents with  . Drug Overdose    accidental     Gavin Lawson is a 69 y.o. male.  HPI Patient reports at 11:30 AM he mistakenly took 8 extra tablets of his Percocet 7.5-325 mg.  He reports he had taken his morning dose and had his bottle of medications next to him.  He thought he was taking his other regular medications which he puts altogether in the same bottle.  He became concerned that it might have adverse effect.  He went on his driveway to talk to his neighbors and they ended up calling EMS.  Patient was given 1 dose of Narcan on route and put on 2 L nasal cannula.  Patient reports he has no symptoms now.  He feels back to normal.  He reports it was unintentional.  He had no plans of hurting himself or killing himself.  He reports in addition to the Percocet, he also takes three 500 mg Tylenol 3 times a day.  He reports he has had 2 doses today.  He reports he chronically gets intermittent episodes of nausea and vomiting.  That is not atypical for him.  He denies he is having any abdominal pain.  He reports he gets a lot of postnasal drip which makes him nauseated as well and sometimes he will get gagging and vomiting.  Reports he took all of his normal morning medications.  He reports he is not due for any additional medications at this point until around midnight.    Past Medical History:  Diagnosis Date  . Arthritis   . Back pain   . COPD (chronic obstructive pulmonary disease) (HCC)   . Coronary artery disease 01/02/16   CABGx3 LIMA  . Depression   . Diabetes mellitus    INSULIN DEPENDENT  . GERD (gastroesophageal reflux disease)   . Headache   . Hypertension   . Sciatic nerve pain   . Shortness of breath dyspnea     Patient Active Problem List   Diagnosis Date Noted  . Hyperkalemia 03/23/2020  . Dyspnea 09/28/2015    . Hypertension 09/28/2015  . OSA (obstructive sleep apnea) 09/28/2015  . Type 2 diabetes mellitus (HCC) 09/28/2015  . GERD (gastroesophageal reflux disease) 09/28/2015  . Dysphagia 09/28/2015  . History of DVT (deep vein thrombosis) 09/28/2015  . Chronic low back pain 09/28/2015    Past Surgical History:  Procedure Laterality Date  . CORONARY ARTERY BYPASS GRAFT  01/02/2016   [name], Dr.Kincaid, SVG on pump  . CYST EXCISION    . KNEE ARTHROSCOPY Bilateral   . TONSILLECTOMY         Family History  Problem Relation Age of Onset  . Diabetes Mother   . Hypertension Mother   . Diabetes Father   . Hypertension Father   . Hypertension Sister   . Hypertension Brother     Social History   Tobacco Use  . Smoking status: Never Smoker  . Smokeless tobacco: Never Used  . Tobacco comment: exposed to second hand smoke   Substance Use Topics  . Alcohol use: Yes    Comment: occ  . Drug use: No    Home Medications Prior to Admission medications   Medication Sig Start Date End Date Taking? Authorizing Provider  acetaminophen (TYLENOL) 500 MG tablet Take 1,500 mg by mouth in the morning  and at bedtime.    Yes [provider]  amLODipine (NORVASC) 10 MG tablet Take 10 mg by mouth daily. 03/17/20  Yes [provider]  baclofen (LIORESAL) 10 MG tablet Take 10 mg by mouth at bedtime.  03/08/20  Yes [provider]  carvedilol (COREG) 25 MG tablet Take 25 mg by mouth 2 (two) times daily. 02/01/20  Yes [provider]  docusate sodium (COLACE) 100 MG capsule Take 100 mg by mouth daily as needed for mild constipation.   Yes [provider]  DULoxetine (CYMBALTA) 60 MG capsule Take 60 mg by mouth daily. 01/11/20  Yes [provider]  famotidine (PEPCID) 40 MG tablet Take 40 mg by mouth at bedtime.   Yes [provider]  insulin NPH Human (NOVOLIN N) 100 UNIT/ML injection Inject 12 Units into the skin 2 (two) times daily before a  meal.   Yes [provider]  lisinopril (ZESTRIL) 10 MG tablet Take 10 mg by mouth 2 (two) times daily. 03/18/20  Yes [provider]  nitroGLYCERIN (NITROSTAT) 0.4 MG SL tablet Place 0.4 mg under the tongue every 5 (five) minutes as needed for chest pain.  03/07/20  Yes [provider]  oxyCODONE-acetaminophen (PERCOCET) 7.5-325 MG tablet Take 1 tablet by mouth daily. 03/21/20  Yes [provider]  pantoprazole (PROTONIX) 40 MG tablet Take 40 mg by mouth 2 (two) times daily.    Yes [provider]  pravastatin (PRAVACHOL) 40 MG tablet Take 40 mg by mouth daily.   Yes [provider]  spironolactone (ALDACTONE) 25 MG tablet Take 12.5 mg by mouth every evening.  01/05/20  Yes [provider]  torsemide (DEMADEX) 20 MG tablet Take 30 mg by mouth daily. 03/02/20  Yes [provider]  vitamin B-12 (CYANOCOBALAMIN) 100 MCG tablet Take 100 mcg by mouth every evening.   Yes [provider]  benzonatate (TESSALON) 100 MG capsule Take 1 capsule (100 mg total) by mouth 3 (three) times daily as needed for cough. Patient not taking: Reported on 03/23/2020 11/19/18   Michela Pitcher A, PA-C  furosemide (LASIX) 40 MG tablet Take 1 tablet (40 mg total) by mouth daily. Patient not taking: Reported on 03/23/2020 09/29/15   Jana Half, MD    Allergies    Bee venom, Codeine, Other, and Penicillins  Review of Systems   Review of Systems 10 Systems reviewed and are negative for acute change except as noted in the HPI.  Physical Exam Updated Vital Signs BP (!) 153/62 (BP Location: Left Arm)   Pulse (!) 51   Temp (!) 97.5 F (36.4 C) (Oral)   Resp 14   Ht 5' 7.5" (1.715 m)   Wt (!) 138.3 kg   SpO2 97%   BMI 47.06 kg/m   Physical Exam Constitutional:      Comments: Patient is alert and nontoxic.  He is sitting in a wheelchair.  Morbid obesity.  No respiratory distress at rest.  HENT:     Head: Normocephalic and atraumatic.      Mouth/Throat:     Mouth: Mucous membranes are moist.     Pharynx: Oropharynx is clear.  Eyes:     Extraocular Movements: Extraocular movements intact.  Cardiovascular:     Rate and Rhythm: Normal rate and regular rhythm.  Pulmonary:     Effort: Pulmonary effort is normal.     Breath sounds: Normal breath sounds.  Abdominal:     General: There is no distension.  Palpations: Abdomen is soft.     Tenderness: There is no abdominal tenderness. There is no guarding.  Musculoskeletal:     Right lower leg: Edema present.     Left lower leg: Edema present.  Skin:    General: Skin is warm and dry.  Neurological:     General: No focal deficit present.     Mental Status: He is oriented to person, place, and time.     Coordination: Coordination normal.  Psychiatric:        Mood and Affect: Mood normal.     ED Results / Procedures / Treatments   Labs (all labs ordered are listed, but only abnormal results are displayed) Labs Reviewed  COMPREHENSIVE METABOLIC PANEL - Abnormal; Notable for the following components:      Result Value   Potassium 6.0 (*)    CO2 21 (*)    Glucose, Bld 160 (*)    BUN 28 (*)    Creatinine, Ser 1.94 (*)    Calcium 8.8 (*)    AST 14 (*)    GFR calc non Af Amer 35 (*)    GFR calc Af Amer 40 (*)    All other components within normal limits  CBC - Abnormal; Notable for the following components:   RBC 3.92 (*)    Hemoglobin 12.3 (*)    HCT 38.5 (*)    All other components within normal limits  POTASSIUM - Abnormal; Notable for the following components:   Potassium 6.0 (*)    All other components within normal limits  SARS CORONAVIRUS 2 (TAT 6-24 HRS)  ACETAMINOPHEN LEVEL  URINALYSIS, ROUTINE W REFLEX MICROSCOPIC  RAPID URINE DRUG SCREEN, HOSP PERFORMED    EKG EKG Interpretation  Date/Time:  Thursday March 23 2020 21:55:42 EDT Ventricular Rate:  58 PR Interval:    QRS Duration: 126 QT Interval:  426 QTC Calculation: 419 R Axis:   148 Text  Interpretation: Junctional rhythm Nonspecific intraventricular conduction delay Probable inferior infarct, old Anterolateral infarct, old similar to previous tracing Confirmed by Charlesetta Shanks [id number]) on 03/23/2020 11:10:06 PM   Radiology No results found.  Procedures Procedures (including critical care time) CRITICAL CARE Performed by: Charlesetta Shanks   Total critical care time: 30 minutes  Critical care time was exclusive of separately billable procedures and treating other patients.  Critical care was necessary to treat or prevent imminent or life-threatening deterioration.  Critical care was time spent personally by me on the following activities: development of treatment plan with patient and/or surrogate as well as nursing, discussions with consultants, evaluation of patient's response to treatment, examination of patient, obtaining history from patient or surrogate, ordering and performing treatments and interventions, ordering and review of laboratory studies, ordering and review of radiographic studies, pulse oximetry and re-evaluation of patient's condition. Medications Ordered in ED Medications  0.9 %  sodium chloride infusion ( Intravenous New Bag/Given 03/23/20 2225)  sodium chloride 0.9 % bolus 500 mL (500 mLs Intravenous New Bag/Given (Non-Interop) 03/23/20 2337)  sodium zirconium cyclosilicate (LOKELMA) packet 10 g (10 g Oral Given 03/23/20 2335)    ED Course  I have reviewed the triage vital signs and the nursing notes.  Pertinent labs & imaging results that were available during my care of the patient were reviewed by me and considered in my medical decision making (see chart for details).  Clinical Course as of Mar 23 2344  Thu Mar 23, 2020  2345 Consult: Dr. Myna Hidalgo for admission   [MP]  Clinical Course User Index [MP] Arby Barrette, MD   MDM Rules/Calculators/A&P                     Patient describes accidentally taking 8 extra Percocet today.  He also  describes taking 3000 mg of Tylenol daily as part of his regular medications.  He denies he is having any abdominal pain.  At this time will check comprehensive metabolic panel, acetaminophen level and CBC.  Patient denies any intentional self-harm.  He has not had any respiratory depression since in the emergency department.  He does not show signs of somnolence or persistent narcotic overdose.  It has been 9 hours since the ingestion.  If labs within normal limits patient will be appropriate for discharge.  Labs obtained, patient has hyperkalemia with mild renal insufficiency.  This was unanticipated.  He has had several episodes of vomiting while in the emergency department.  Patient initially reported that he fairly frequently gets nausea with some vomiting but subsequently qualifies that it really just kind of acted up this evening.  Does not have any abdominal pain or fever.  He does not have diarrhea.  With renal insufficiency and hyperkalemia will need to initiate fluid hydration.  Patient does take torsemide and lisinopril which may be the etiology.  He also reports that he eats a couple of bananas a day because it is a convenient food.  Does not take any supplemental oral potassium.  Patient does not have acute EKG changes will give Lokelma.  Plan for admission for hydration and hyperkalemia with AKI and vomiting. Final Clinical Impression(s) / ED Diagnoses Final diagnoses:  Acute drug overdose, accidental or unintentional, initial encounter  Hyperkalemia  AKI (acute kidney injury) (HCC)  Non-intractable vomiting with nausea, unspecified vomiting type    Rx / DC Orders ED Discharge Orders    None       Arby Barrette, MD 03/23/20 2032    Arby Barrette, MD 03/23/20 [place]    Arby Barrette, MD 03/23/20 2345

## 2020-03-23 NOTE — ED Triage Notes (Signed)
Per [facility] pt accidentally took 8 oxycodone 7.5-325mg  instead of taking his normal medications. Typically only takes 1 oxy per day. Pt given Narcan 1mg  in route and put on oxygen 2L. Given 1L NS in route via 18g left forearm.

## 2020-03-24 ENCOUNTER — Encounter (HOSPITAL_COMMUNITY): Payer: Self-pay | Admitting: Family Medicine

## 2020-03-24 DIAGNOSIS — E875 Hyperkalemia: Secondary | ICD-10-CM | POA: Diagnosis not present

## 2020-03-24 DIAGNOSIS — N170 Acute kidney failure with tubular necrosis: Secondary | ICD-10-CM

## 2020-03-24 DIAGNOSIS — T50901A Poisoning by unspecified drugs, medicaments and biological substances, accidental (unintentional), initial encounter: Secondary | ICD-10-CM | POA: Diagnosis present

## 2020-03-24 DIAGNOSIS — N179 Acute kidney failure, unspecified: Secondary | ICD-10-CM | POA: Diagnosis present

## 2020-03-24 DIAGNOSIS — N183 Chronic kidney disease, stage 3 unspecified: Secondary | ICD-10-CM | POA: Diagnosis present

## 2020-03-24 DIAGNOSIS — T402X1A Poisoning by other opioids, accidental (unintentional), initial encounter: Secondary | ICD-10-CM | POA: Diagnosis not present

## 2020-03-24 LAB — URINALYSIS, ROUTINE W REFLEX MICROSCOPIC
Bilirubin Urine: NEGATIVE
Glucose, UA: NEGATIVE mg/dL
Hgb urine dipstick: NEGATIVE
Ketones, ur: NEGATIVE mg/dL
Leukocytes,Ua: NEGATIVE
Nitrite: NEGATIVE
Protein, ur: NEGATIVE mg/dL
Specific Gravity, Urine: 1.015 (ref 1.005–1.030)
pH: 5 (ref 5.0–8.0)

## 2020-03-24 LAB — COMPREHENSIVE METABOLIC PANEL
ALT: 13 U/L (ref 0–44)
ALT: 13 U/L (ref 0–44)
AST: 15 U/L (ref 15–41)
AST: 15 U/L (ref 15–41)
Albumin: 3.7 g/dL (ref 3.5–5.0)
Albumin: 3.4 g/dL — ABNORMAL LOW (ref 3.5–5.0)
Alkaline Phosphatase: 53 U/L (ref 38–126)
Alkaline Phosphatase: 48 U/L (ref 38–126)
Anion gap: 8 (ref 5–15)
Anion gap: 6 (ref 5–15)
BUN: 27 mg/dL — ABNORMAL HIGH (ref 8–23)
BUN: 23 mg/dL (ref 8–23)
CO2: 23 mmol/L (ref 22–32)
CO2: 26 mmol/L (ref 22–32)
Calcium: 8.4 mg/dL — ABNORMAL LOW (ref 8.9–10.3)
Calcium: 8.4 mg/dL — ABNORMAL LOW (ref 8.9–10.3)
Chloride: 108 mmol/L (ref 98–111)
Chloride: 105 mmol/L (ref 98–111)
Creatinine, Ser: 1.67 mg/dL — ABNORMAL HIGH (ref 0.61–1.24)
Creatinine, Ser: 1.27 mg/dL — ABNORMAL HIGH (ref 0.61–1.24)
GFR calc Af Amer: 48 mL/min — ABNORMAL LOW (ref >60)
GFR calc Af Amer: >60 mL/min (ref >60)
GFR calc non Af Amer: 42 mL/min — ABNORMAL LOW (ref >60)
GFR calc non Af Amer: 58 mL/min — ABNORMAL LOW (ref >60)
Glucose, Bld: 137 mg/dL — ABNORMAL HIGH (ref 70–99)
Glucose, Bld: 182 mg/dL — ABNORMAL HIGH (ref 70–99)
Potassium: 5.8 mmol/L — ABNORMAL HIGH (ref 3.5–5.1)
Potassium: 5.2 mmol/L — ABNORMAL HIGH (ref 3.5–5.1)
Sodium: 139 mmol/L (ref 135–145)
Sodium: 137 mmol/L (ref 135–145)
Total Bilirubin: 0.7 mg/dL (ref 0.3–1.2)
Total Bilirubin: 0.6 mg/dL (ref 0.3–1.2)
Total Protein: 6.6 g/dL (ref 6.5–8.1)
Total Protein: 6.2 g/dL — ABNORMAL LOW (ref 6.5–8.1)

## 2020-03-24 LAB — BASIC METABOLIC PANEL
Anion gap: 7 (ref 5–15)
Anion gap: 6 (ref 5–15)
BUN: 27 mg/dL — ABNORMAL HIGH (ref 8–23)
BUN: 24 mg/dL — ABNORMAL HIGH (ref 8–23)
CO2: 23 mmol/L (ref 22–32)
CO2: 26 mmol/L (ref 22–32)
Calcium: 8.5 mg/dL — ABNORMAL LOW (ref 8.9–10.3)
Calcium: 8.6 mg/dL — ABNORMAL LOW (ref 8.9–10.3)
Chloride: 108 mmol/L (ref 98–111)
Chloride: 106 mmol/L (ref 98–111)
Creatinine, Ser: 1.56 mg/dL — ABNORMAL HIGH (ref 0.61–1.24)
Creatinine, Ser: 1.32 mg/dL — ABNORMAL HIGH (ref 0.61–1.24)
GFR calc Af Amer: 52 mL/min — ABNORMAL LOW (ref >60)
GFR calc Af Amer: >60 mL/min (ref >60)
GFR calc non Af Amer: 45 mL/min — ABNORMAL LOW (ref >60)
GFR calc non Af Amer: 55 mL/min — ABNORMAL LOW (ref >60)
Glucose, Bld: 140 mg/dL — ABNORMAL HIGH (ref 70–99)
Glucose, Bld: 192 mg/dL — ABNORMAL HIGH (ref 70–99)
Potassium: 5.5 mmol/L — ABNORMAL HIGH (ref 3.5–5.1)
Potassium: 5 mmol/L (ref 3.5–5.1)
Sodium: 138 mmol/L (ref 135–145)
Sodium: 138 mmol/L (ref 135–145)

## 2020-03-24 LAB — CBG MONITORING, ED
Glucose-Capillary: 149 mg/dL — ABNORMAL HIGH (ref 70–99)
Glucose-Capillary: 178 mg/dL — ABNORMAL HIGH (ref 70–99)

## 2020-03-24 LAB — ACETAMINOPHEN LEVEL: Acetaminophen (Tylenol), Serum: <10 ug/mL — ABNORMAL LOW (ref 10–30)

## 2020-03-24 LAB — RAPID URINE DRUG SCREEN, HOSP PERFORMED
Amphetamines: NOT DETECTED
Barbiturates: NOT DETECTED
Benzodiazepines: NOT DETECTED
Cocaine: NOT DETECTED
Opiates: POSITIVE — AB
Tetrahydrocannabinol: NOT DETECTED

## 2020-03-24 LAB — GLUCOSE, CAPILLARY
Glucose-Capillary: 148 mg/dL — ABNORMAL HIGH (ref 70–99)
Glucose-Capillary: 156 mg/dL — ABNORMAL HIGH (ref 70–99)

## 2020-03-24 LAB — CREATININE, URINE, RANDOM: Creatinine, Urine: 131.04 mg/dL

## 2020-03-24 LAB — CBC
HCT: 36 % — ABNORMAL LOW (ref 39.0–52.0)
Hemoglobin: 11.4 g/dL — ABNORMAL LOW (ref 13.0–17.0)
MCH: 30.7 pg (ref 26.0–34.0)
MCHC: 31.7 g/dL (ref 30.0–36.0)
MCV: 97 fL (ref 80.0–100.0)
Platelets: 238 10*3/uL (ref 150–400)
RBC: 3.71 MIL/uL — ABNORMAL LOW (ref 4.22–5.81)
RDW: 12.4 % (ref 11.5–15.5)
WBC: 7.4 10*3/uL (ref 4.0–10.5)
nRBC: 0 % (ref 0.0–0.2)

## 2020-03-24 LAB — SODIUM, URINE, RANDOM: Sodium, Ur: 86 mmol/L

## 2020-03-24 LAB — SARS CORONAVIRUS 2 (TAT 6-24 HRS): SARS Coronavirus 2: NEGATIVE

## 2020-03-24 LAB — HIV ANTIBODY (ROUTINE TESTING W REFLEX): HIV Screen 4th Generation wRfx: NONREACTIVE

## 2020-03-24 MED ORDER — PANTOPRAZOLE SODIUM 40 MG PO TBEC
40.0000 mg | DELAYED_RELEASE_TABLET | Freq: Two times a day (BID) | ORAL | Status: DC
  Administered 2020-03-24 – 2020-03-25 (×3): 40 mg via ORAL
  Filled 2020-03-24 (×3): qty 1

## 2020-03-24 MED ORDER — SODIUM CHLORIDE 0.9 % IV SOLN: INTRAVENOUS | Status: DC

## 2020-03-24 MED ORDER — SODIUM CHLORIDE 0.9% FLUSH: 3.0000 mL | Freq: Two times a day (BID) | INTRAVENOUS | Status: DC

## 2020-03-24 MED ORDER — DULOXETINE HCL 30 MG PO CPEP
60.0000 mg | ORAL_CAPSULE | Freq: Every day | ORAL | Status: DC
  Administered 2020-03-24 – 2020-03-25 (×2): 60 mg via ORAL
  Filled 2020-03-24 (×2): qty 2

## 2020-03-24 MED ORDER — PRAVASTATIN SODIUM 20 MG PO TABS
40.0000 mg | ORAL_TABLET | Freq: Every day | ORAL | Status: DC
  Administered 2020-03-24 – 2020-03-25 (×2): 40 mg via ORAL
  Filled 2020-03-24 (×2): qty 2

## 2020-03-24 MED ORDER — INSULIN ASPART 100 UNIT/ML ~~LOC~~ SOLN
0.0000 [IU] | Freq: Three times a day (TID) | SUBCUTANEOUS | Status: DC
  Administered 2020-03-24 (×2): 1 [IU] via SUBCUTANEOUS
  Administered 2020-03-24: 13:00:00 2 [IU] via SUBCUTANEOUS
  Administered 2020-03-25: 1 [IU] via SUBCUTANEOUS
  Administered 2020-03-25: 13:00:00 2 [IU] via SUBCUTANEOUS
  Filled 2020-03-24: qty 0.09

## 2020-03-24 MED ORDER — INSULIN ASPART 100 UNIT/ML ~~LOC~~ SOLN
0.0000 [IU] | Freq: Every day | SUBCUTANEOUS | Status: DC
  Filled 2020-03-24: qty 0.05

## 2020-03-24 MED ORDER — ONDANSETRON HCL 4 MG/2ML IJ SOLN: 4.0000 mg | Freq: Four times a day (QID) | INTRAMUSCULAR | Status: DC | PRN

## 2020-03-24 MED ORDER — BACLOFEN 10 MG PO TABS
5.0000 mg | ORAL_TABLET | Freq: Every day | ORAL | Status: DC
  Administered 2020-03-24: 5 mg via ORAL
  Filled 2020-03-24: qty 1

## 2020-03-24 MED ORDER — ONDANSETRON HCL 4 MG PO TABS: 4.0000 mg | ORAL_TABLET | Freq: Four times a day (QID) | ORAL | Status: DC | PRN

## 2020-03-24 MED ORDER — FAMOTIDINE 20 MG PO TABS
40.0000 mg | ORAL_TABLET | Freq: Every day | ORAL | Status: DC
  Administered 2020-03-24: 22:00:00 40 mg via ORAL
  Filled 2020-03-24: qty 2

## 2020-03-24 MED ORDER — HEPARIN SODIUM (PORCINE) 5000 UNIT/ML IJ SOLN
5000.0000 [IU] | Freq: Three times a day (TID) | INTRAMUSCULAR | Status: DC
  Administered 2020-03-24 – 2020-03-25 (×2): 5000 [IU] via SUBCUTANEOUS
  Filled 2020-03-24 (×2): qty 1

## 2020-03-24 NOTE — H&P (Signed)
History and Physical    Key Cen PRF:[id number] DOB: [birth year] DOA: 03/23/2020  PCP: Patient, No Pcp Per   Patient coming from: Home   Chief Complaint: Accidental Percocet overdose   HPI: Gavin Lawson is a 69 y.o. male with medical history significant for chronic hip and back pain, coronary artery disease status post CABG, chronic diastolic CHF, hypertension, insulin-dependent diabetes mellitus, and mild renal insufficiency, presenting to the emergency department after an accidental Percocet overdose.  Patient reports that he had been in his usual state of health aside from some intermittent nausea and nonbloody vomiting that he describes as chronic, explains that he keeps his pills all mixed in 1 bottle, and inadvertently took 8 or 9 tablets of Percocet 7.5-325 mg this morning little after 11 AM.  He also took 3 tablets of acetaminophen 500 mg.  He has felt generally well following this, but after discussing the situation with the neighbor, decided to seek evaluation in the ED.  He denies any chest pain, leg swelling or tenderness, fevers, or chills.  Denies any lightheadedness.  Reports occasional nonbloody vomiting without abdominal pain.  ED Course: Upon arrival to the ED, patient is found to be afebrile, saturating well on room air, bradycardic in the low 50s, and with stable blood pressure.  EKG features a junctional rhythm with rate 58, nonspecific IVCD, and normal QTc interval.  Chemistry panel notable for potassium of 6.0 and creatinine 1.94, up from 1.29 in January 2021.  CBC with mild normocytic anemia.  Urinalysis unremarkable.  Acetaminophen level 24.  Patient was given 500 cc bolus of normal saline and Lokelma in the ED.  Review of Systems:  All other systems reviewed and apart from HPI, are negative.  Past Medical History:  Diagnosis Date  . Arthritis   . Back pain   . COPD (chronic obstructive pulmonary disease) ([place])   . Coronary artery disease 01/02/16   CABGx3 LIMA  .  Depression   . Diabetes mellitus    INSULIN DEPENDENT  . GERD (gastroesophageal reflux disease)   . Headache   . Hypertension   . Sciatic nerve pain   . Shortness of breath dyspnea     Past Surgical History:  Procedure Laterality Date  . CORONARY ARTERY BYPASS GRAFT  01/02/2016   [place], Dr.Kincaid, SVG on pump  . CYST EXCISION    . KNEE ARTHROSCOPY Bilateral   . TONSILLECTOMY       reports that he has never smoked. He has never used smokeless tobacco. He reports current alcohol use. He reports that he does not use drugs.  Allergies  Allergen Reactions  . Bee Venom Shortness Of Breath and Rash  . Codeine Nausea And Vomiting  . Other Nausea And Vomiting    Tylenol #3  . Penicillins Rash and Other (See Comments)    Has patient had a PCN reaction causing immediate rash, facial/tongue/throat swelling, SOB or lightheadedness with hypotension:/[id number]} Has patient had a PCN reaction causing severe rash involving mucus membranes or skin necrosis: Not a serious rash but developed a slight rash Has patient had a PCN reaction that required hospitalization No Has patient had a PCN reaction occurring within the last 10 years: /[id number]} If all of the above answers are "NO", then may proceed with Cephalosporin use    Family History  Problem Relation Age of Onset  . Diabetes Mother   . Hypertension Mother   . Diabetes Father   . Hypertension Father   . Hypertension Sister   .  Hypertension Brother      Prior to Admission medications   Medication Sig Start Date End Date Taking? Authorizing Provider  acetaminophen (TYLENOL) 500 MG tablet Take 1,500 mg by mouth in the morning and at bedtime.    Yes [provider]  amLODipine (NORVASC) 10 MG tablet Take 10 mg by mouth daily. 03/17/20  Yes [provider]  baclofen (LIORESAL) 10 MG tablet Take 10 mg by mouth at bedtime.  03/08/20  Yes [provider]  carvedilol (COREG) 25 MG tablet Take 25 mg by mouth 2  (two) times daily. 02/01/20  Yes [provider]  docusate sodium (COLACE) 100 MG capsule Take 100 mg by mouth daily as needed for mild constipation.   Yes [provider]  DULoxetine (CYMBALTA) 60 MG capsule Take 60 mg by mouth daily. 01/11/20  Yes [provider]  famotidine (PEPCID) 40 MG tablet Take 40 mg by mouth at bedtime.   Yes [provider]  insulin NPH Human (NOVOLIN N) 100 UNIT/ML injection Inject 12 Units into the skin 2 (two) times daily before a meal.   Yes [provider]  lisinopril (ZESTRIL) 10 MG tablet Take 10 mg by mouth 2 (two) times daily. 03/18/20  Yes [provider]  nitroGLYCERIN (NITROSTAT) 0.4 MG SL tablet Place 0.4 mg under the tongue every 5 (five) minutes as needed for chest pain.  03/07/20  Yes [provider]  oxyCODONE-acetaminophen (PERCOCET) 7.5-325 MG tablet Take 1 tablet by mouth daily. 03/21/20  Yes [provider]  pantoprazole (PROTONIX) 40 MG tablet Take 40 mg by mouth 2 (two) times daily.    Yes [provider]  pravastatin (PRAVACHOL) 40 MG tablet Take 40 mg by mouth daily.   Yes [provider]  spironolactone (ALDACTONE) 25 MG tablet Take 12.5 mg by mouth every evening.  01/05/20  Yes [provider]  torsemide (DEMADEX) 20 MG tablet Take 30 mg by mouth daily. 03/02/20  Yes [provider]  vitamin B-12 (CYANOCOBALAMIN) 100 MCG tablet Take 100 mcg by mouth every evening.   Yes [provider]  benzonatate (TESSALON) 100 MG capsule Take 1 capsule (100 mg total) by mouth 3 (three) times daily as needed for cough. Patient not taking: Reported on 03/23/2020 11/19/18   Michela Pitcher A, PA-C  furosemide (LASIX) 40 MG tablet Take 1 tablet (40 mg total) by mouth daily. Patient not taking: Reported on 03/23/2020 09/29/15   Jana Half, MD    Physical Exam: Vitals:   03/23/20 1715 03/23/20 2144 03/23/20 2302 03/24/20 0202  BP: (!) 145/72 (!) 174/77  (!) 153/62 (!) 147/59  Pulse: (!) 54 (!) 48 (!) 51 (!) 59  Resp: 18 18 14 11   Temp: 98.5 F (36.9 C)  (!) 97.5 F (36.4 C)   TempSrc: Oral  Oral   SpO2: 100% 97% 97% 98%  Weight:  (!) 138.3 kg (!) 138.3 kg   Height:  5' 7.5" (1.715 m) 5' 7.5" (1.715 m)     Constitutional: NAD, calm  Eyes: PERTLA, lids and conjunctivae normal ENMT: Mucous membranes are moist. Posterior pharynx clear of any exudate or lesions.   Neck: normal, supple, no masses, no thyromegaly Respiratory:  no wheezing, no crackles. No accessory muscle use.  Cardiovascular: S1 & S2 heard, regular rate and rhythm. No extremity edema.  Abdomen: No distension, no tenderness, soft. Bowel sounds active.  Musculoskeletal: no clubbing / cyanosis. No joint deformity upper and lower extremities.   Skin: no significant  rashes, lesions, ulcers. Warm, dry, well-perfused. Neurologic: no facial asymmetry. Sensation intact. Moving all extremities.  Psychiatric: Alert and oriented x 3. Calm and cooperative.    Labs and Imaging on Admission: I have personally reviewed following labs and imaging studies  CBC: Recent Labs  Lab 03/23/20 2022  WBC 9.9  HGB 12.3*  HCT 38.5*  MCV 98.2  PLT 280   Basic Metabolic Panel: Recent Labs  Lab 03/23/20 2022 03/23/20 2238  NA 137  --   K 6.0* 6.0*  CL 107  --   CO2 21*  --   GLUCOSE 160*  --   BUN 28*  --   CREATININE 1.94*  --   CALCIUM 8.8*  --    GFR: Estimated Creatinine Clearance: 50 mL/min (A) (by C-G formula based on SCr of 1.94 mg/dL (H)). Liver Function Tests: Recent Labs  Lab 03/23/20 2022  AST 14*  ALT 13  ALKPHOS 53  BILITOT 0.6  PROT 7.2  ALBUMIN 4.0   No results for input(s): LIPASE, AMYLASE in the last 168 hours. No results for input(s): AMMONIA in the last 168 hours. Coagulation Profile: No results for input(s): INR, PROTIME in the last 168 hours. Cardiac Enzymes: No results for input(s): CKTOTAL, CKMB, CKMBINDEX, TROPONINI in the last 168 hours. BNP  (last 3 results) No results for input(s): PROBNP in the last 8760 hours. HbA1C: No results for input(s): HGBA1C in the last 72 hours. CBG: No results for input(s): GLUCAP in the last 168 hours. Lipid Profile: No results for input(s): CHOL, HDL, LDLCALC, TRIG, CHOLHDL, LDLDIRECT in the last 72 hours. Thyroid Function Tests: No results for input(s): TSH, T4TOTAL, FREET4, T3FREE, THYROIDAB in the last 72 hours. Anemia Panel: No results for input(s): VITAMINB12, FOLATE, FERRITIN, TIBC, IRON, RETICCTPCT in the last 72 hours. Urine analysis:    Component Value Date/Time   COLORURINE YELLOW 03/23/2020 2313   APPEARANCEUR CLEAR 03/23/2020 2313   LABSPEC 1.015 03/23/2020 2313   PHURINE 5.0 03/23/2020 2313   GLUCOSEU NEGATIVE 03/23/2020 2313   HGBUR NEGATIVE 03/23/2020 2313   BILIRUBINUR NEGATIVE 03/23/2020 2313   KETONESUR NEGATIVE 03/23/2020 2313   PROTEINUR NEGATIVE 03/23/2020 2313   NITRITE NEGATIVE 03/23/2020 2313   LEUKOCYTESUR NEGATIVE 03/23/2020 2313   Sepsis Labs: @LABRCNTIP (procalcitonin:4,lacticidven:4) )No results found for this or any previous visit (from the past 240 hour(s)).   Radiological Exams on Admission: No results found.  EKG: Independently reviewed. Junctional rhythm, rate 58, non-specific IVCD.   Assessment/Plan   1. Acute kidney injury superimposed on CKD IIIa; hyperkalemia  - Presents for evaluation of accidental overdose and is found to have potassium of 6.0 with SCr of 1.94, up from 1.29 in January 2021  - He was treated with IVF and Lokelma in ED  - Check urine chemistries, hold lisinopril and Aldactone, continue IVF hydration, repeat chem panel    2. Accidental opiate overdose  - Presents for evaluation after accidentally taking 8 or 9 tablets of oxycodone-acetaminophen 7.5-325 mg in additional to 1,500 mg of plain acetaminophen  - RR is normal in ED, acetaminophen level 24  - Hold opiates and acetaminophen, repeat APAP level    3. Chronic pain  -  Complains of chronic hip pain  - Hold opiates for now in light of overdose, try heating pad     4. CAD  - No anginal complaints  - Continue statin    5. Chronic diastolic CHF  - Appears compensated  - Hold diuretics and ACE in light of increased  SCr, continue gentle IVF hydration, check wt, follow I/Os, hold beta-blocker in light of some HR readings in 40s in ED    6. Insulin-dependent DM  - A1c was 8.2% in January 2021   - Continue CBGs, continue insulin with dose-reduction for decreased GFR    DVT prophylaxis: sq heparin  Code Status: Full  Family Communication: Discussed with patient  Disposition Plan: Likely home in 24-48 hrs pending normalization of potassium level and improving renal function  Consults called: None  Admission status:  Observation    Briscoe Deutscher, MD [facility] Pager: See [email or web address]  If 7AM-7PM, please contact the daytime attending [email or web address]  03/24/2020, 2:52 AM

## 2020-03-24 NOTE — Progress Notes (Signed)
[facility]                                                                              Patient Demographics  Gavin Lawson, is a 69 y.o. male, DOB - 1951-03-10, QMV:[id number]  Admit date - 03/23/2020   Admitting Physician Briscoe Deutscher, MD  Outpatient Primary MD for the patient is Patient, No Pcp Per  Outpatient specialists:   LOS - 0  days   Medical records reviewed and are as summarized below:    Chief Complaint  Patient presents with  . Drug Overdose    accidental        Brief summary   Patient is a 69 year old male with history of chronic hip and back pain, CAD status post CABG, chronic diastolic CHF, hypertension, IDDM, mild renal insufficiency presented to ED after an accidental Percocet overdose.  Patient reported that he was in his usual state of health aside from intermittent nausea and nonbloody vomiting which he described as chronic.  He reported that he keeps all his pills mixed in 1 bottle and in ultimately took 8 or 9 tablets of Percocet 7.5-325 mg on the morning of admission.  He also took 3 tablets of acetaminophen 500 mg.  After discussing the situation with his neighbor he decided to seek evaluation in ED. In ED, potassium noted to be 6.0, creatinine 1.9, was 1.29 in 12/2019.  CBC showed mild normocytic anemia.  Acetaminophen level 24.  Patient was given 500 cc normal saline bolus and Lokelma in ED.   Assessment & Plan    Principal Problem: Acute kidney injury on CKD stage IIIa, hyperkalemia -Received Lokelma in ED, repeat labs done afternoon showed potassium still trending up 5.8 -Initially improving creatinine this morning, now trending up again to 1.67 - will continue IV fluids, continue to hold lisinopril and Aldactone - follow BMET, LFTs   Accidental Percocet overdose -Follow LFTs, renal function, acetaminophen level -Currently vital signs normal -Discussed with the patient regarding creating a pillbox, judicious use of narcotics  to avoid accidental overdose, follow closely with pain management  Chronic pain -Patient reports that he follows pain management clinic, was on fentanyl patch in the past however could not afford it -For now hold opiates in the light of overdose, outpatient follow-up with pain management  Coronary artery disease status post CABG Currently stable, no chest pain or shortness of breath     Chronic diastolic CHF -Currently euvolemic, compensated. -Hold lisinopril, Aldactone in light of acute kidney injury -Monitor volume status closely with IV fluids      Insulin-dependent DM  - A1c was 8.2% in January 2021 -Continue sliding scale insulin     GERD -Continue PPI   Obesity Estimated body mass index is 47.06 kg/m as calculated from the following:   Height as of this encounter: 5' 7.5" (1.715 m).   Weight as of this encounter: 138.3 kg.  Code Status: Full code DVT Prophylaxis:   SCD's Family Communication: Discussed all imaging results, lab results, explained to the patient*   Disposition Plan: Patient from home, anticipate discharge home once renal function is improved, likely tomorrow   Time  Spent in minutes   35 minutes  Procedures:  None Consultants:   None  Antimicrobials:   Anti-infectives (From admission, onward)   None          Medications  Scheduled Meds: . insulin aspart  0-5 Units Subcutaneous QHS  . insulin aspart  0-9 Units Subcutaneous TID WC  . pantoprazole  40 mg Oral BID   Continuous Infusions: . sodium chloride 100 mL/hr at 03/24/20 0745   PRN Meds:.ondansetron **OR** ondansetron (ZOFRAN) IV      Subjective:   [name] was seen and examined today.  Has chronic pain, nausea.  Currently no vomiting, fever chills, chest pain. Patient denies dizziness, shortness of breath, abdominal pain, N/V/D/C, new weakness, numbess, tingling. No acute events overnight.    Objective:   Vitals:   03/24/20 0938 03/24/20 1030 03/24/20 1100  03/24/20 1116  BP: (!) 130/51 (!) 151/56 (!) 145/60 (!) 145/60  Pulse: 60 (!) 59 60 (!) 56  Resp: 13 12 11 14   Temp: 98.5 F (36.9 C)     TempSrc:      SpO2: 94% 96% 95% 94%  Weight:      Height:        Intake/Output Summary (Last 24 hours) at 03/24/2020 1515 Last data filed at 03/24/2020 0040 Gross per 24 hour  Intake 500 ml  Output -  Net 500 ml     Wt Readings from Last 3 Encounters:  03/23/20 (!) 138.3 kg  07/24/16 (!) 139.1 kg  07/17/16 (!) 140.1 kg     Exam  General: Alert and oriented x 3, NAD  Cardiovascular: S1 S2 auscultated, no murmurs, RRR  Respiratory: Clear to auscultation bilaterally, no wheezing, rales or rhonchi  Gastrointestinal: Soft, nontender, nondistended, + bowel sounds  Ext: no pedal edema bilaterally  Neuro: no new deficits  Musculoskeletal: No digital cyanosis, clubbing  Skin: No rashes  Psych: Normal affect and demeanor, alert and oriented x3    Data Reviewed:  I have personally reviewed following labs and imaging studies  Micro Results Recent Results (from the past 240 hour(s))  SARS CORONAVIRUS 2 (TAT 6-24 HRS) Nasopharyngeal Nasopharyngeal Swab     Status: None   Collection Time: 03/23/20 11:13 PM   Specimen: Nasopharyngeal Swab  Result Value Ref Range Status   SARS Coronavirus 2 NEGATIVE NEGATIVE Final    Comment: (NOTE) SARS-CoV-2 target nucleic acids are NOT DETECTED. The SARS-CoV-2 RNA is generally detectable in upper and lower respiratory specimens during the acute phase of infection. Negative results do not preclude SARS-CoV-2 infection, do not rule out co-infections with other pathogens, and should not be used as the sole basis for treatment or other patient management decisions. Negative results must be combined with clinical observations, patient history, and epidemiological information. The expected result is Negative. Fact Sheet for Patients: SugarRoll.be Fact Sheet for Healthcare  Providers: https://www.woods-mathews.com/ This test is not yet approved or cleared by the [place] FDA and  has been authorized for detection and/or diagnosis of SARS-CoV-2 by FDA under an Emergency Use Authorization (EUA). This EUA will remain  in effect (meaning this test can be used) for the duration of the COVID-19 declaration under Section 56 4(b)(1) of the Act, 21 U.S.C. section 360bbb-3(b)(1), unless the authorization is terminated or revoked sooner. Performed at [facility], [address], [place], [place] [address]     Radiology Reports No results found.  Lab Data:  CBC: Recent Labs  Lab 03/23/20 2022 03/24/20 0242  WBC 9.9  7.4  HGB 12.3* 11.4*  HCT 38.5* 36.0*  MCV 98.2 97.0  PLT 280 238   Basic Metabolic Panel: Recent Labs  Lab 03/23/20 2022 03/23/20 2238 03/24/20 0242 03/24/20 1049  NA 137  --  138 139  138  K 6.0* 6.0* 5.5* 5.8*  5.0  CL 107  --  108 108  106  CO2 21*  --  23 23  26   GLUCOSE 160*  --  140* 137*  192*  BUN 28*  --  27* 27*  24*  CREATININE 1.94*  --  1.56* 1.67*  1.32*  CALCIUM 8.8*  --  8.5* 8.4*  8.6*   GFR: Estimated Creatinine Clearance: 73.5 mL/min (A) (by C-G formula based on SCr of 1.32 mg/dL (H)). Liver Function Tests: Recent Labs  Lab 03/23/20 2022 03/24/20 1049  AST 14* 15  ALT 13 13  ALKPHOS 53 53  BILITOT 0.6 0.7  PROT 7.2 6.6  ALBUMIN 4.0 3.7   No results for input(s): LIPASE, AMYLASE in the last 168 hours. No results for input(s): AMMONIA in the last 168 hours. Coagulation Profile: No results for input(s): INR, PROTIME in the last 168 hours. Cardiac Enzymes: No results for input(s): CKTOTAL, CKMB, CKMBINDEX, TROPONINI in the last 168 hours. BNP (last 3 results) No results for input(s): PROBNP in the last 8760 hours. HbA1C: No results for input(s): HGBA1C in the last 72 hours. CBG: Recent Labs  Lab 03/24/20 0840 03/24/20 1202  GLUCAP 149* 178*   Lipid Profile: No  results for input(s): CHOL, HDL, LDLCALC, TRIG, CHOLHDL, LDLDIRECT in the last 72 hours. Thyroid Function Tests: No results for input(s): TSH, T4TOTAL, FREET4, T3FREE, THYROIDAB in the last 72 hours. Anemia Panel: No results for input(s): VITAMINB12, FOLATE, FERRITIN, TIBC, IRON, RETICCTPCT in the last 72 hours. Urine analysis:    Component Value Date/Time   COLORURINE YELLOW 03/23/2020 2313   APPEARANCEUR CLEAR 03/23/2020 2313   LABSPEC 1.015 03/23/2020 2313   PHURINE 5.0 03/23/2020 2313   GLUCOSEU NEGATIVE 03/23/2020 2313   HGBUR NEGATIVE 03/23/2020 2313   BILIRUBINUR NEGATIVE 03/23/2020 2313   KETONESUR NEGATIVE 03/23/2020 2313   PROTEINUR NEGATIVE 03/23/2020 2313   NITRITE NEGATIVE 03/23/2020 2313   LEUKOCYTESUR NEGATIVE 03/23/2020 2313     Ripudeep Rai M.D. [facility] 03/24/2020, 3:15 PM   Call night coverage person covering after 7pm

## 2020-03-24 NOTE — ED Notes (Signed)
Rayfield Citizen, RN attempted to call report for patient at this time. Floor, RN requested until 1540. Rayfield Citizen will call back to give report then.

## 2020-03-24 NOTE — ED Notes (Signed)
Pt lying in bed, eye's closed, chest rising and falling. NAD noted. Will continue to monitor.  

## 2020-03-24 NOTE — ED Notes (Signed)
Pt lying in bed awake and talking. Denies any needs. Will continue to monitor.  

## 2020-03-25 DIAGNOSIS — T402X1A Poisoning by other opioids, accidental (unintentional), initial encounter: Secondary | ICD-10-CM | POA: Diagnosis not present

## 2020-03-25 DIAGNOSIS — N170 Acute kidney failure with tubular necrosis: Secondary | ICD-10-CM | POA: Diagnosis not present

## 2020-03-25 DIAGNOSIS — E875 Hyperkalemia: Secondary | ICD-10-CM | POA: Diagnosis not present

## 2020-03-25 DIAGNOSIS — T50901A Poisoning by unspecified drugs, medicaments and biological substances, accidental (unintentional), initial encounter: Secondary | ICD-10-CM | POA: Diagnosis not present

## 2020-03-25 DIAGNOSIS — N183 Chronic kidney disease, stage 3 unspecified: Secondary | ICD-10-CM

## 2020-03-25 DIAGNOSIS — N179 Acute kidney failure, unspecified: Secondary | ICD-10-CM | POA: Diagnosis not present

## 2020-03-25 LAB — COMPREHENSIVE METABOLIC PANEL
ALT: 11 U/L (ref 0–44)
AST: 12 U/L — ABNORMAL LOW (ref 15–41)
Albumin: 3.4 g/dL — ABNORMAL LOW (ref 3.5–5.0)
Alkaline Phosphatase: 45 U/L (ref 38–126)
Anion gap: 8 (ref 5–15)
BUN: 17 mg/dL (ref 8–23)
CO2: 24 mmol/L (ref 22–32)
Calcium: 8.8 mg/dL — ABNORMAL LOW (ref 8.9–10.3)
Chloride: 107 mmol/L (ref 98–111)
Creatinine, Ser: 1.04 mg/dL (ref 0.61–1.24)
GFR calc Af Amer: >60 mL/min (ref >60)
GFR calc non Af Amer: >60 mL/min (ref >60)
Glucose, Bld: 165 mg/dL — ABNORMAL HIGH (ref 70–99)
Potassium: 4.9 mmol/L (ref 3.5–5.1)
Sodium: 139 mmol/L (ref 135–145)
Total Bilirubin: 0.5 mg/dL (ref 0.3–1.2)
Total Protein: 6.1 g/dL — ABNORMAL LOW (ref 6.5–8.1)

## 2020-03-25 LAB — CBC
HCT: 32.7 % — ABNORMAL LOW (ref 39.0–52.0)
Hemoglobin: 10.8 g/dL — ABNORMAL LOW (ref 13.0–17.0)
MCH: 31.7 pg (ref 26.0–34.0)
MCHC: 33 g/dL (ref 30.0–36.0)
MCV: 95.9 fL (ref 80.0–100.0)
Platelets: 237 10*3/uL (ref 150–400)
RBC: 3.41 MIL/uL — ABNORMAL LOW (ref 4.22–5.81)
RDW: 12.1 % (ref 11.5–15.5)
WBC: 6.3 10*3/uL (ref 4.0–10.5)
nRBC: 0 % (ref 0.0–0.2)

## 2020-03-25 LAB — GLUCOSE, CAPILLARY
Glucose-Capillary: 135 mg/dL — ABNORMAL HIGH (ref 70–99)
Glucose-Capillary: 158 mg/dL — ABNORMAL HIGH (ref 70–99)

## 2020-03-25 MED ORDER — AMLODIPINE BESYLATE 10 MG PO TABS
10.0000 mg | ORAL_TABLET | Freq: Every day | ORAL | Status: DC
  Administered 2020-03-25: 10:00:00 10 mg via ORAL
  Filled 2020-03-25: qty 1

## 2020-03-25 MED ORDER — TORSEMIDE 20 MG PO TABS: 30.0000 mg | ORAL_TABLET | Freq: Every day | ORAL | Status: AC

## 2020-03-25 MED ORDER — LISINOPRIL 10 MG PO TABS: 10.0000 mg | ORAL_TABLET | Freq: Two times a day (BID) | ORAL | Status: DC

## 2020-03-25 MED ORDER — SPIRONOLACTONE 25 MG PO TABS: 12.5000 mg | ORAL_TABLET | Freq: Every evening | ORAL | Status: DC

## 2020-03-25 MED ORDER — CARVEDILOL 25 MG PO TABS
25.0000 mg | ORAL_TABLET | Freq: Two times a day (BID) | ORAL | Status: DC
  Administered 2020-03-25: 10:00:00 25 mg via ORAL
  Filled 2020-03-25: qty 1

## 2020-03-25 NOTE — Evaluation (Signed)
Physical Therapy Evaluation Patient Details Name: Russ Looper MRN: [id number] DOB: 01/17/1951 Today's Date: 03/25/2020   History of Present Illness  69 y.o. male with medical history significant for chronic hip and back pain, coronary artery disease status post CABG, chronic diastolic CHF, hypertension, insulin-dependent diabetes mellitus, and mild renal insufficiency, presenting to the emergency department after an accidental Percocet overdose. Dx of hyperkalemia.   Clinical Impression   Pt ambulated 4' with RW, no loss of balance. 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 Pt appears to be at baseline of modified independence with mobility. From PT standpoint, he is ready to DC home.       Follow Up Recommendations No PT follow up    Equipment Recommendations  None recommended by PT    Recommendations for Other Services       Precautions / Restrictions Precautions Precautions: None Precaution Comments: denies h/o falls in past 1 year Restrictions Weight Bearing Restrictions: No      Mobility  Bed Mobility Overal bed mobility: Modified Independent             General bed mobility comments: used rail  Transfers Overall transfer level: Modified independent Equipment used: Rolling walker (2 wheeled)             General transfer comment: demonstrates good safety awareness  Ambulation/Gait Ambulation/Gait assistance: Modified independent (Device/Increase time) Gait Distance (Feet): 75 Feet Assistive device: Rolling walker (2 wheeled) Gait Pattern/deviations: Step-to pattern Gait velocity: decr   General Gait Details: steady, no loss of balance, 1 brief standing rest break with forearms on handles of RW  Stairs            Wheelchair Mobility    Modified Rankin (Stroke Patients Only)       Balance Overall balance assessment: Modified Independent                                            Pertinent Vitals/Pain Pain Assessment: 0-10 Pain Score: 8  Pain Location: R hip, chronic pain 2* OA per pt Pain Descriptors / Indicators: Sore Pain Intervention(s): Limited activity within patient's tolerance;Monitored during session;Relaxation;Repositioned    Home Living Family/patient expects to be discharged to:: Private residence Living Arrangements: Alone Available Help at Discharge: Friend(s);Available PRN/intermittently   Home Access: Stairs to enter Entrance Stairs-Rails: None Entrance Stairs-Number of Steps: 2 Home Layout: One level Home Equipment: Walker - 4 wheels;Bedside commode;Cane - single point      Prior Function Level of Independence: Independent with assistive device(s)         Comments: walks with rollator, drives, independent ADLs; had a roommate but he died 5 days ago     Hand Dominance        Extremity/Trunk Assessment   Upper Extremity Assessment Upper Extremity Assessment: Overall WFL for tasks assessed    Lower Extremity Assessment Lower Extremity Assessment: Overall WFL for tasks assessed(RLE movement painful at baseline 2* OA in hip, knee ext at least 3/5)    Cervical / Trunk Assessment Cervical / Trunk Assessment: Normal  Communication   Communication: No difficulties  Cognition Arousal/Alertness: Awake/alert Behavior During Therapy: WFL for tasks assessed/performed Overall Cognitive Status: Within Functional Limits for tasks assessed  General Comments      Exercises     Assessment/Plan    PT Assessment Patent does not need any further PT services  PT Problem List         PT Treatment Interventions      PT Goals (Current goals can be found in the Care Plan section)  Acute Rehab PT Goals PT Goal Formulation: All assessment and education complete, DC therapy    Frequency     Barriers to discharge        Co-evaluation               AM-PAC PT "6  Clicks" Mobility  Outcome Measure Help needed turning from your back to your side while in a flat bed without using bedrails?: None Help needed moving from lying on your back to sitting on the side of a flat bed without using bedrails?: None Help needed moving to and from a bed to a chair (including a wheelchair)?: None Help needed standing up from a chair using your arms (e.g., wheelchair or bedside chair)?: None Help needed to walk in hospital room?: None Help needed climbing 3-5 steps with a railing? : None 6 Click Score: 24    End of Session Equipment Utilized During Treatment: Gait belt Activity Tolerance: Patient tolerated treatment well Patient left: in bed;with call bell/phone within reach;with nursing/sitter in room Nurse Communication: Mobility status      Time: 1313-1330 PT Time Calculation (min) (ACUTE ONLY): 17 min   Charges:   PT Evaluation $PT Eval Low Complexity: 1 Low         Tamala Ser PT 03/25/2020  Acute Rehabilitation Services Pager [phone] Office [phone]

## 2020-03-25 NOTE — Discharge Summary (Signed)
Physician Discharge Summary   Patient ID: Gavin Lawson MRN: [id number] DOB/AGE: 02-04-1951 69 y.o.  Admit date: 03/23/2020 Discharge date: 03/25/2020  Primary Care Physician:  Patient, No Pcp Per   Recommendations for Outpatient Follow-up:  1. Follow up with PCP in 1-2 weeks 2. Patient recommended strongly for using pillbox, careful and judicious use of his pain medications.  He will follow-up with his pain management  Home Health: PT evaluating the patient prior to discharge Equipment/Devices:   Discharge Condition: stable  CODE STATUS: FULL  Diet recommendation: Heart healthy   Discharge Diagnoses:   . Overdose, accidental or unintentional, initial encounter . Hyperkalemia . Chronic low back pain . Acute renal failure superimposed on stage 3a chronic kidney disease (HCC) . Hypertension . Type 2 diabetes mellitus, IDDM . Coronary artery disease . Chronic diastolic CHF (congestive heart failure) (HCC) Obesity  Consults: None    Allergies:   Allergies  Allergen Reactions  . Bee Venom Shortness Of Breath and Rash  . Codeine Nausea And Vomiting  . Other Nausea And Vomiting    Tylenol #3  . Penicillins Rash and Other (See Comments)    Has patient had a PCN reaction causing immediate rash, facial/tongue/throat swelling, SOB or lightheadedness with hypotension:/[id number]} Has patient had a PCN reaction causing severe rash involving mucus membranes or skin necrosis: Not a serious rash but developed a slight rash Has patient had a PCN reaction that required hospitalization No Has patient had a PCN reaction occurring within the last 10 years: /[id number]} If all of the above answers are "NO", then may proceed with Cephalosporin use     DISCHARGE MEDICATIONS: Allergies as of 03/25/2020      Reactions   Bee Venom Shortness Of Breath, Rash   Codeine Nausea And Vomiting   Other Nausea And Vomiting   Tylenol #3   Penicillins Rash, Other (See Comments)   Has patient had  a PCN reaction causing immediate rash, facial/tongue/throat swelling, SOB or lightheadedness with hypotension:/[id number]} Has patient had a PCN reaction causing severe rash involving mucus membranes or skin necrosis: Not a serious rash but developed a slight rash Has patient had a PCN reaction that required hospitalization No Has patient had a PCN reaction occurring within the last 10 years: /[id number]} If all of the above answers are "NO", then may proceed with Cephalosporin use      Medication List    TAKE these medications   acetaminophen 500 MG tablet Commonly known as: TYLENOL Take 1,500 mg by mouth in the morning and at bedtime.   amLODipine 10 MG tablet Commonly known as: NORVASC Take 10 mg by mouth daily.   baclofen 10 MG tablet Commonly known as: LIORESAL Take 10 mg by mouth at bedtime.   carvedilol 25 MG tablet Commonly known as: COREG Take 25 mg by mouth 2 (two) times daily.   docusate sodium 100 MG capsule Commonly known as: COLACE Take 100 mg by mouth daily as needed for mild constipation.   DULoxetine 60 MG capsule Commonly known as: CYMBALTA Take 60 mg by mouth daily.   famotidine 40 MG tablet Commonly known as: PEPCID Take 40 mg by mouth at bedtime.   insulin NPH Human 100 UNIT/ML injection Commonly known as: NOVOLIN N Inject 12 Units into the skin 2 (two) times daily before a meal.   lisinopril 10 MG tablet Commonly known as: ZESTRIL Take 1 tablet (10 mg total) by mouth 2 (two) times daily. Start taking on: March 26, 2020   nitroGLYCERIN 0.4  MG SL tablet Commonly known as: NITROSTAT Place 0.4 mg under the tongue every 5 (five) minutes as needed for chest pain.   oxyCODONE-acetaminophen 7.5-325 MG tablet Commonly known as: PERCOCET Take 1 tablet by mouth daily.   pantoprazole 40 MG tablet Commonly known as: PROTONIX Take 40 mg by mouth 2 (two) times daily.   pravastatin 40 MG tablet Commonly known as: PRAVACHOL Take 40 mg by mouth  daily.   spironolactone 25 MG tablet Commonly known as: ALDACTONE Take 0.5 tablets (12.5 mg total) by mouth every evening. Start taking on: March 26, 2020   torsemide 20 MG tablet Commonly known as: DEMADEX Take 1.5 tablets (30 mg total) by mouth daily. Start taking on: March 26, 2020   vitamin B-12 100 MCG tablet Commonly known as: CYANOCOBALAMIN Take 100 mcg by mouth every evening.        Brief H and P: For complete details please refer to admission H and P, but in brief  Patient is a 69 year old male with history of chronic hip and back pain, CAD status post CABG, chronic diastolic CHF, hypertension, IDDM, mild renal insufficiency presented to ED after an accidental Percocet overdose.  Patient reported that he was in his usual state of health aside from intermittent nausea and nonbloody vomiting which he described as chronic.  He reported that he keeps all his pills mixed in 1 bottle and in ultimately took 8 or 9 tablets of Percocet 7.5-325 mg on the morning of admission.  He also took 3 tablets of acetaminophen 500 mg.  After discussing the situation with his neighbor he decided to seek evaluation in ED. In ED, potassium noted to be 6.0, creatinine 1.9, was 1.29 in 12/2019.  CBC showed mild normocytic anemia.  Acetaminophen level 24.  Patient was given 500 cc normal saline bolus and Lokelma in ED.  Hospital Course:   Acute kidney injury on CKD stage IIIa, hyperkalemia -Received Lokelma in ED potassium now improved to 4.9 at the time of discharge. -Initially improving creatinine this morning, then trended and plateaued at 1.67 - Lisinopril and Aldactone were held, Demadex was held -LFTs normal, creatinine has normalized to 1.04 He may resume his cardiac medications in next 24 hours outpatient   Accidental Percocet overdose -LFTs normalized, repeat acetaminophen level less than 10 at the time of discharge -Remained hemodynamically stable -Discussed with the patient regarding  creating a pillbox, judicious use of narcotics to avoid accidental overdose, follow closely with pain management  Chronic pain -Patient reports that he follows pain management clinic, was on fentanyl patch in the past however could not afford it -Patient will follow with his pain management  Coronary artery disease status post CABG Currently stable, no chest pain or shortness of breath   Chronic diastolic CHF -Currently euvolemic and compensated Patient may resume his cardiac medications outpatient in next 24 hours   Insulin-dependent DM -A1c was 8.2% in January 2021 -Continue sliding scale insulin   GERD -Continue PPI   Obesity Estimated body mass index is 47.06 kg/m as calculated from the following:   Height as of this encounter: 5' 7.5" (1.715 m).   Weight as of this encounter: 138.3 kg.  Day of Discharge S: No acute complaints today, doing well, working with PT prior to discharge  BP (!) 166/65 (BP Location: Left Arm)   Pulse 65   Temp 98.6 F (37 C) (Oral)   Resp 15   Ht 5' 7.5" (1.715 m)   Wt (!) 140.1 kg  SpO2 96%   BMI 47.66 kg/m   Physical Exam: General: Alert and awake oriented x3 not in any acute distress. HEENT: anicteric sclera, pupils reactive to light and accommodation CVS: S1-S2 clear no murmur rubs or gallops Chest: clear to auscultation bilaterally, no wheezing rales or rhonchi Abdomen: soft nontender, nondistended, normal bowel sounds Extremities: no cyanosis, clubbing or edema noted bilaterally Neuro: Cranial nerves II-XII intact, no focal neurological deficits    Get Medicines reviewed and adjusted: Please take all your medications with you for your next visit with your Primary MD  Please request your Primary MD to go over all hospital tests and procedure/radiological results at the follow up. Please ask your Primary MD to get all Hospital records sent to his/her office.  If you experience worsening of your admission  symptoms, develop shortness of breath, life threatening emergency, suicidal or homicidal thoughts you must seek medical attention immediately by calling 911 or calling your MD immediately  if symptoms less severe.  You must read complete instructions/literature along with all the possible adverse reactions/side effects for all the Medicines you take and that have been prescribed to you. Take any new Medicines after you have completely understood and accept all the possible adverse reactions/side effects.   Do not drive when taking pain medications.   Do not take more than prescribed Pain, Sleep and Anxiety Medications  Special Instructions: If you have smoked or chewed Tobacco  in the last 2 yrs please stop smoking, stop any regular Alcohol  and or any Recreational drug use.  Wear Seat belts while driving.  Please note  You were cared for by a hospitalist during your hospital stay. Once you are discharged, your primary care physician will handle any further medical issues. Please note that NO REFILLS for any discharge medications will be authorized once you are discharged, as it is imperative that you return to your primary care physician (or establish a relationship with a primary care physician if you do not have one) for your aftercare needs so that they can reassess your need for medications and monitor your lab values.   The results of significant diagnostics from this hospitalization (including imaging, microbiology, ancillary and laboratory) are listed below for reference.      Procedures/Studies:   No results found.    LAB RESULTS: Basic Metabolic Panel: Recent Labs  Lab 03/24/20 1618 03/25/20 0629  NA 137 139  K 5.2* 4.9  CL 105 107  CO2 26 24  GLUCOSE 182* 165*  BUN 23 17  CREATININE 1.27* 1.04  CALCIUM 8.4* 8.8*   Liver Function Tests: Recent Labs  Lab 03/24/20 1618 03/25/20 0629  AST 15 12*  ALT 13 11  ALKPHOS 48 45  BILITOT 0.6 0.5  PROT 6.2* 6.1*   ALBUMIN 3.4* 3.4*   No results for input(s): LIPASE, AMYLASE in the last 168 hours. No results for input(s): AMMONIA in the last 168 hours. CBC: Recent Labs  Lab 03/24/20 0242 03/24/20 0242 03/25/20 0629  WBC 7.4  --  6.3  HGB 11.4*  --  10.8*  HCT 36.0*  --  32.7*  MCV 97.0   < > 95.9  PLT 238  --  237   < > = values in this interval not displayed.   Cardiac Enzymes: No results for input(s): CKTOTAL, CKMB, CKMBINDEX, TROPONINI in the last 168 hours. BNP: Invalid input(s): POCBNP CBG: Recent Labs  Lab 03/25/20 0737 03/25/20 1139  GLUCAP 135* 158*  Disposition and Follow-up: Discharge Instructions    Diet Carb Modified   Complete by: As directed    Discharge instructions   Complete by: As directed    Please start your BP and heart medications gradually, lisinopril, demadex, aldactone starting tomorrow   Please create a pill box and take your medications carefully. Follow-up with your pain management doctor.   Increase activity slowly   Complete by: As directed        DISPOSITION: Home   DISCHARGE FOLLOW-UP Follow-up Information    Lucioni,Tomas Matias,MD. Schedule an appointment as soon as possible for a visit in 2 week(s).   Why: Please follow your primary doctor in 10-14 DAYS  Contact information: [address]  [place], [place] [address]  [phone]            Time coordinating discharge:  35-minute  Signed:   Estill Cotta M.D. [facility] 03/25/2020, 1:18 PM

## 2020-03-26 LAB — UREA NITROGEN, URINE: Urea Nitrogen, Ur: 309 mg/dL

## 2020-06-27 IMAGING — DX DG CHEST 2V
2 series · 2 of 2 positions shown · non-contrast
Comparison: 11/03/2017

CLINICAL DATA: Dyspnea since [REDACTED]

EXAM:
CHEST - 2 VIEW

[chest lat]
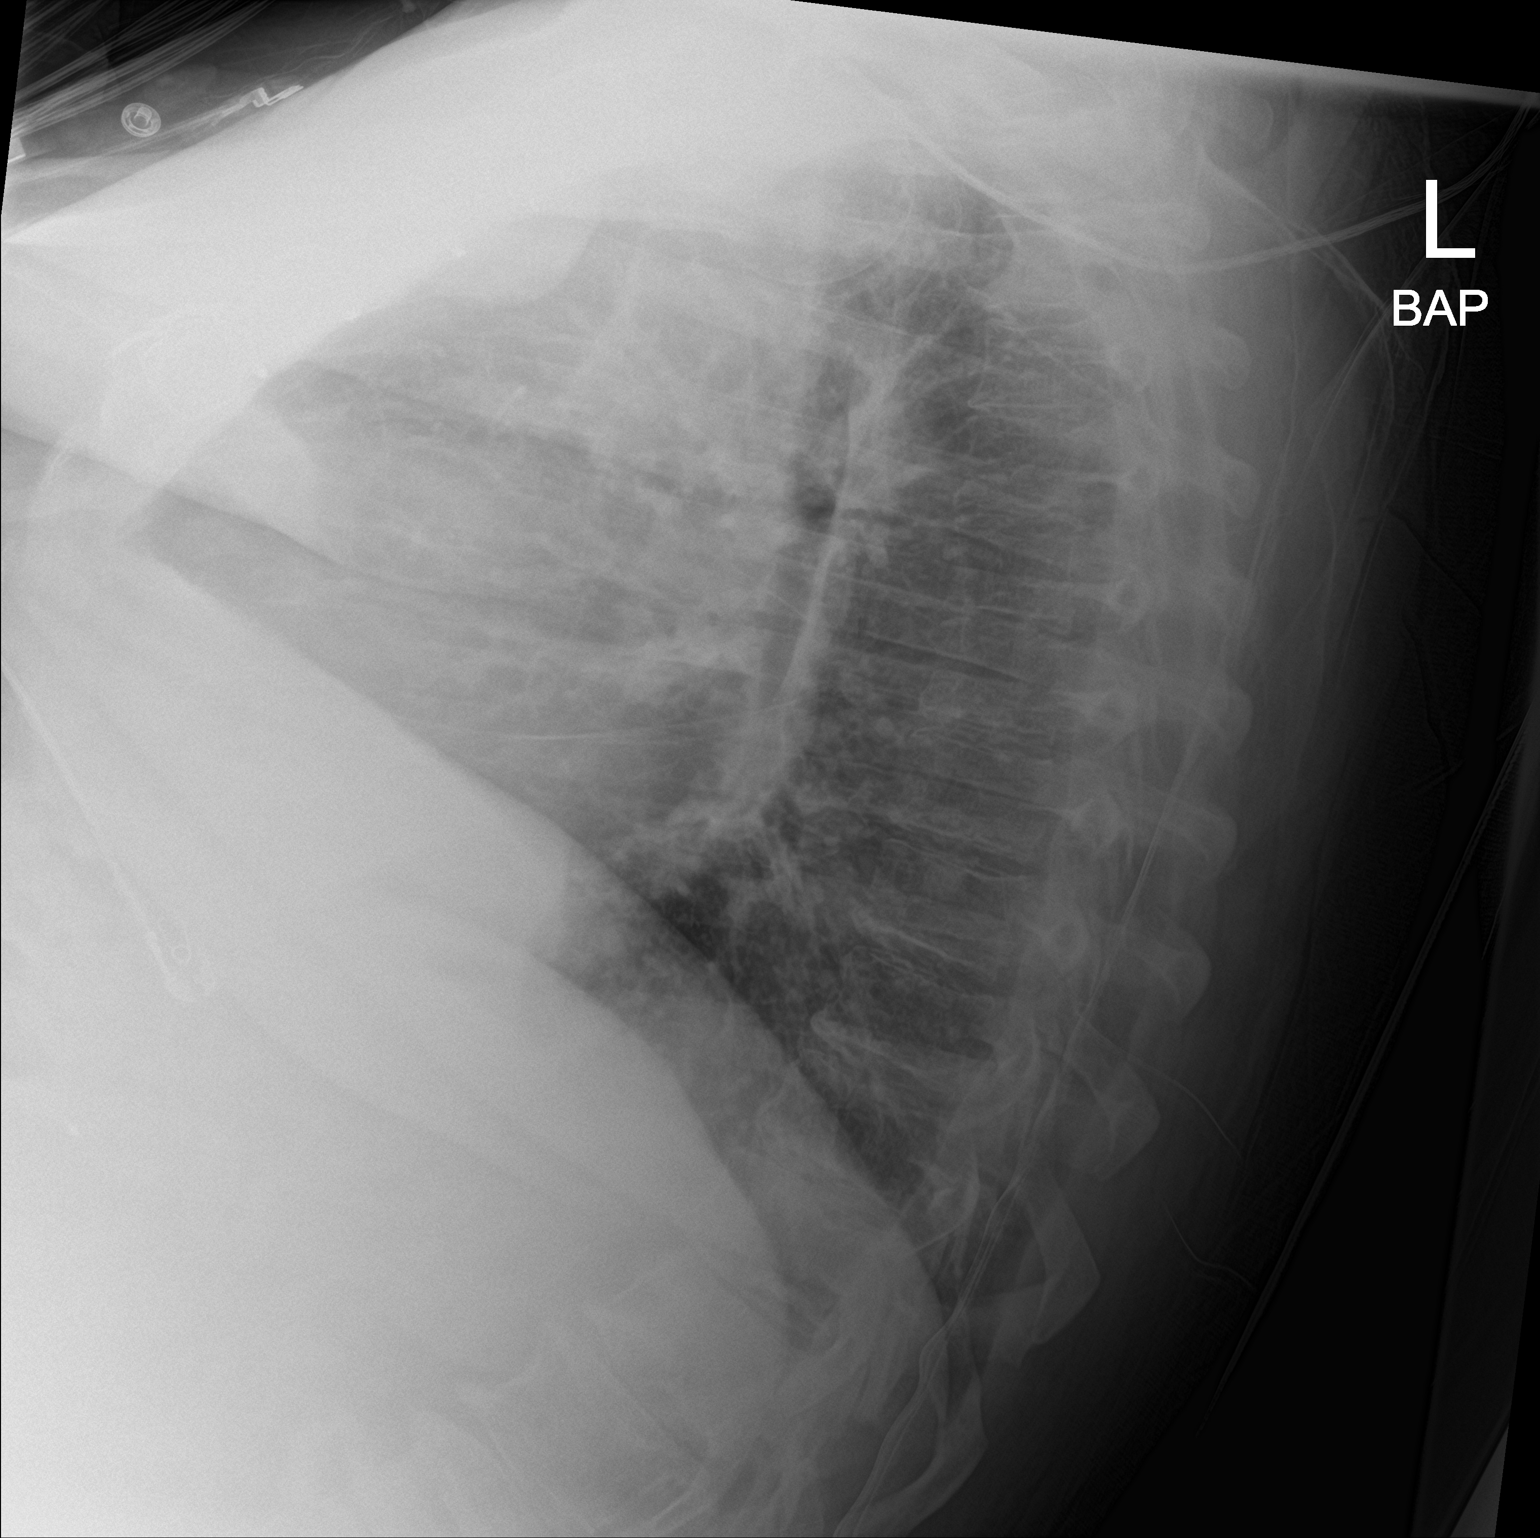

[chest ap]
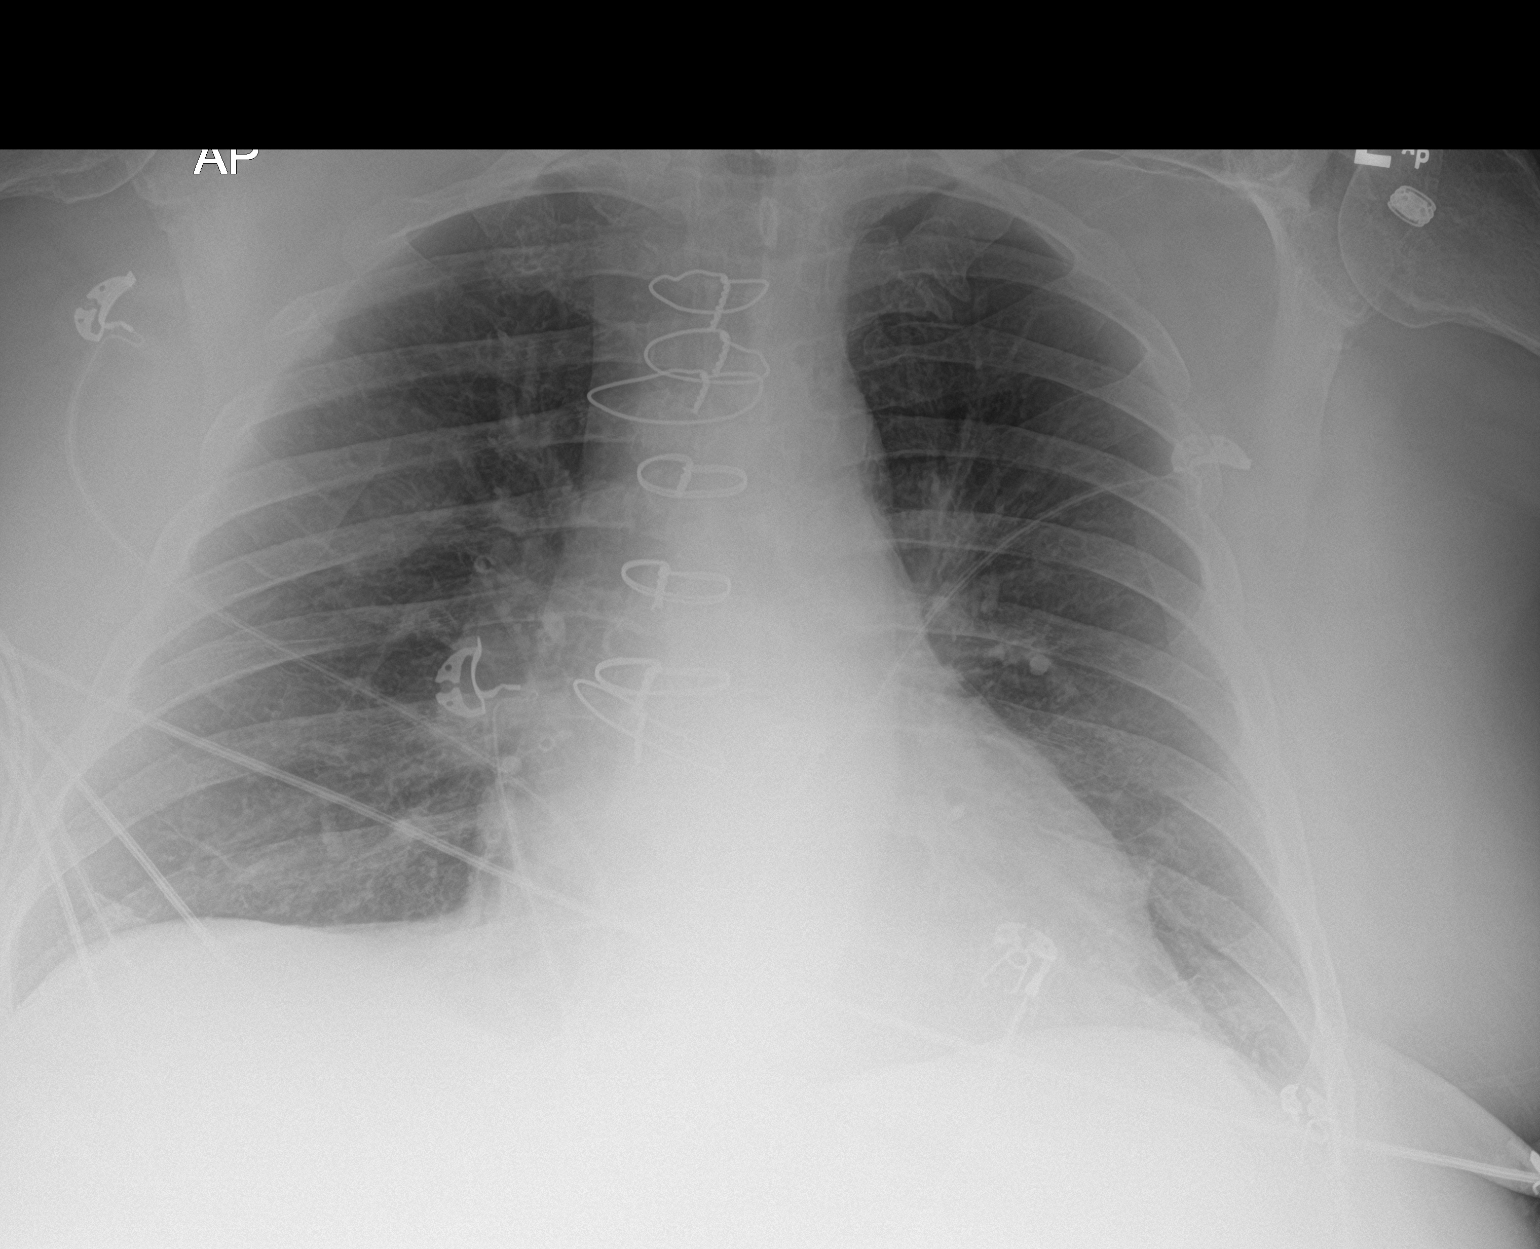

[2 of 2 positions shown; findings below may reference images not displayed]

FINDINGS: Stable cardiomegaly with minimal aortic atherosclerosis. Median
sternotomy sutures are in place. Lungs are clear. No acute osseous
abnormality. Mild osteoarthritic joint space narrowing of the
glenohumeral and included left acromioclavicular joint. Mild
degenerative change along the dorsal spine.
IMPRESSION: Stable cardiomegaly without active pulmonary disease.

## 2020-08-01 ENCOUNTER — Ambulatory Visit (INDEPENDENT_AMBULATORY_CARE_PROVIDER_SITE_OTHER): Payer: Medicare Other | Admitting: Orthopaedic Surgery

## 2020-08-01 ENCOUNTER — Encounter: Payer: Self-pay | Admitting: Orthopaedic Surgery

## 2020-08-01 ENCOUNTER — Ambulatory Visit (INDEPENDENT_AMBULATORY_CARE_PROVIDER_SITE_OTHER): Payer: Medicare Other

## 2020-08-01 DIAGNOSIS — M1612 Unilateral primary osteoarthritis, left hip: Secondary | ICD-10-CM | POA: Diagnosis not present

## 2020-08-01 DIAGNOSIS — M1611 Unilateral primary osteoarthritis, right hip: Secondary | ICD-10-CM | POA: Insufficient documentation

## 2020-08-01 DIAGNOSIS — Z6841 Body Mass Index (BMI) 40.0 and over, adult: Secondary | ICD-10-CM

## 2020-08-01 DIAGNOSIS — I2510 Atherosclerotic heart disease of native coronary artery without angina pectoris: Secondary | ICD-10-CM | POA: Diagnosis not present

## 2020-08-01 NOTE — Progress Notes (Signed)
Office Visit Note   Patient: Gavin Lawson           Date of Birth: Apr 10, 1951           MRN: [id number] Visit Date: 08/01/2020              Requested by: No referring provider defined for this encounter. PCP: Patient, No Pcp Per   Assessment & Plan: Visit Diagnoses:  1. Primary osteoarthritis of right hip   2. Primary osteoarthritis of left hip   3. Body mass index 45.0-49.9, adult (HCC)   4. 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  Unfortunately his BMI is well above acceptable limits from a surgical risk standpoint.  I agree that he needs to lose a significant amount of weight before surgery can be safely done.  He did agree to referral to Dr. Dalbert Garnet for weight loss counseling.  He is also considering gastric bypass surgery. Total face to face encounter time was greater than 45 minutes and over half of this time was spent in counseling and/or coordination of care.  Follow-Up Instructions: Return if symptoms worsen or fail to improve.   Orders:  Orders Placed This Encounter  Procedures  . XR HIPS BILAT W OR W/O PELVIS 3-4 VIEWS   No orders of the defined types were placed in this encounter.     Procedures: No procedures performed   Clinical Data: No additional findings.   Subjective: Chief Complaint  Patient presents with  . Right Hip - Pain    Gavin Lawson is a 69 year old gentleman here for second opinion.  He has seen numerous orthopedic surgeons in [facility] for severe chronic bilateral hip pain worse on the right.  He is minimally ambulatory due to the pain.  He uses a walker for ambulation.  Prior cortisone injections have provided him with approximately 2 weeks of relief.  He is under chronic pain clinic taking oxycodone.  Recently prescribed Celebrex which helps slightly.  He is retired.  He has been evaluated by several orthopedic surgeons who have determined that his BMI is unacceptable for a total  joint replacement.   Review of Systems  Constitutional: Negative.   All other systems reviewed and are negative.    Objective: Vital Signs: There were no vitals taken for this visit.  Physical Exam Vitals and nursing note reviewed.  Constitutional:      Appearance: He is well-developed.  HENT:     Head: Normocephalic and atraumatic.  Eyes:     Pupils: Pupils are equal, round, and reactive to light.  Pulmonary:     Effort: Pulmonary effort is normal.  Abdominal:     Palpations: Abdomen is soft.  Musculoskeletal:        General: Normal range of motion.     Cervical back: Neck supple.  Skin:    General: Skin is warm.  Neurological:     Mental Status: He is alert and oriented to person, place, and time.  Psychiatric:        Behavior: Behavior normal.        Thought Content: Thought content normal.        Judgment: Judgment normal.     Ortho Exam Bilateral hips show minimal range of motion with severe pain.  Exam is severely limited by pain and guarding and sitting in a wheelchair. Specialty Comments:  No specialty comments available.  Imaging: XR HIPS BILAT W OR W/O PELVIS 3-4 VIEWS  Result Date: 08/01/2020 Severe collapse of right femoral head.  Mild collapse of left femoral head.  Bilateral hip DJD    PMFS History: Patient Active Problem List   Diagnosis Date Noted  . Primary osteoarthritis of right hip 08/01/2020  . Primary osteoarthritis of left hip 08/01/2020  . Body mass index 45.0-49.9, adult (HCC) 08/01/2020  . Morbid obesity (HCC) 08/01/2020  . Acute renal failure superimposed on stage 3 chronic kidney disease (HCC) 03/24/2020  . Overdose, accidental or unintentional, initial encounter 03/24/2020  . Hyperkalemia 03/23/2020  . Coronary artery disease 01/02/2016  . Chronic diastolic CHF (congestive heart failure) (HCC) 01/02/2016  . Dyspnea 09/28/2015  . Hypertension 09/28/2015  . OSA (obstructive sleep apnea) 09/28/2015  . Insulin-requiring or  dependent type II diabetes mellitus (HCC) 09/28/2015  . GERD (gastroesophageal reflux disease) 09/28/2015  . Dysphagia 09/28/2015  . History of DVT (deep vein thrombosis) 09/28/2015  . Chronic low back pain 09/28/2015   Past Medical History:  Diagnosis Date  . Arthritis   . Back pain   . COPD (chronic obstructive pulmonary disease) (HCC)   . Coronary artery disease 01/02/16   CABGx3 LIMA  . Depression   . Diabetes mellitus    INSULIN DEPENDENT  . GERD (gastroesophageal reflux disease)   . Headache   . Hypertension   . Sciatic nerve pain   . Shortness of breath dyspnea     Family History  Problem Relation Age of Onset  . Diabetes Mother   . Hypertension Mother   . Diabetes Father   . Hypertension Father   . Hypertension Sister   . Hypertension Brother     Past Surgical History:  Procedure Laterality Date  . CORONARY ARTERY BYPASS GRAFT  01/02/2016   [name], Dr.Kincaid, SVG on pump  . CYST EXCISION    . KNEE ARTHROSCOPY Bilateral   . TONSILLECTOMY     Social History   Occupational History  . Not on file  Tobacco Use  . Smoking status: Never Smoker  . Smokeless tobacco: Never Used  . Tobacco comment: exposed to second hand smoke   Substance and Sexual Activity  . Alcohol use: Yes    Comment: occ  . Drug use: No  . Sexual activity: Not on file

## 2020-08-03 ENCOUNTER — Other Ambulatory Visit: Payer: Self-pay

## 2020-08-03 DIAGNOSIS — Z6841 Body Mass Index (BMI) 40.0 and over, adult: Secondary | ICD-10-CM

## 2020-08-17 ENCOUNTER — Other Ambulatory Visit (INDEPENDENT_AMBULATORY_CARE_PROVIDER_SITE_OTHER): Payer: Self-pay | Admitting: Family Medicine

## 2020-08-17 ENCOUNTER — Encounter (INDEPENDENT_AMBULATORY_CARE_PROVIDER_SITE_OTHER): Payer: Self-pay | Admitting: Family Medicine

## 2020-08-17 ENCOUNTER — Ambulatory Visit (INDEPENDENT_AMBULATORY_CARE_PROVIDER_SITE_OTHER): Payer: Medicare Other | Admitting: Family Medicine

## 2020-08-17 ENCOUNTER — Other Ambulatory Visit: Payer: Self-pay

## 2020-08-17 VITALS — BP 152/84 | Ht 68.0 in | Wt 305.0 lb

## 2020-08-17 DIAGNOSIS — Z1331 Encounter for screening for depression: Secondary | ICD-10-CM

## 2020-08-17 DIAGNOSIS — I2510 Atherosclerotic heart disease of native coronary artery without angina pectoris: Secondary | ICD-10-CM

## 2020-08-17 DIAGNOSIS — I10 Essential (primary) hypertension: Secondary | ICD-10-CM

## 2020-08-17 DIAGNOSIS — I152 Hypertension secondary to endocrine disorders: Secondary | ICD-10-CM

## 2020-08-17 DIAGNOSIS — R0602 Shortness of breath: Secondary | ICD-10-CM

## 2020-08-17 DIAGNOSIS — E1159 Type 2 diabetes mellitus with other circulatory complications: Secondary | ICD-10-CM | POA: Diagnosis not present

## 2020-08-17 DIAGNOSIS — Z6841 Body Mass Index (BMI) 40.0 and over, adult: Secondary | ICD-10-CM | POA: Diagnosis not present

## 2020-08-17 DIAGNOSIS — D649 Anemia, unspecified: Secondary | ICD-10-CM

## 2020-08-17 DIAGNOSIS — E1169 Type 2 diabetes mellitus with other specified complication: Secondary | ICD-10-CM

## 2020-08-17 DIAGNOSIS — Z0289 Encounter for other administrative examinations: Secondary | ICD-10-CM

## 2020-08-17 DIAGNOSIS — R5383 Other fatigue: Secondary | ICD-10-CM | POA: Diagnosis not present

## 2020-08-17 DIAGNOSIS — E785 Hyperlipidemia, unspecified: Secondary | ICD-10-CM

## 2020-08-18 LAB — CBC WITH DIFFERENTIAL/PLATELET
Basophils Absolute: 0.1 x10E3/uL (ref 0.0–0.2)
Basos: 1 %
EOS (ABSOLUTE): 0.2 x10E3/uL (ref 0.0–0.4)
Eos: 3 %
Hematocrit: 36.8 % — ABNORMAL LOW (ref 37.5–51.0)
Hemoglobin: 11.9 g/dL — ABNORMAL LOW (ref 13.0–17.7)
Immature Grans (Abs): 0 x10E3/uL (ref 0.0–0.1)
Immature Granulocytes: 0 %
Lymphocytes Absolute: 1.4 x10E3/uL (ref 0.7–3.1)
Lymphs: 21 %
MCH: 30 pg (ref 26.6–33.0)
MCHC: 32.3 g/dL (ref 31.5–35.7)
MCV: 93 fL (ref 79–97)
Monocytes Absolute: 0.7 x10E3/uL (ref 0.1–0.9)
Monocytes: 10 %
Neutrophils Absolute: 4.6 x10E3/uL (ref 1.4–7.0)
Neutrophils: 65 %
Platelets: 242 x10E3/uL (ref 150–450)
RBC: 3.97 x10E6/uL — ABNORMAL LOW (ref 4.14–5.80)
RDW: 12.2 % (ref 11.6–15.4)
WBC: 7 x10E3/uL (ref 3.4–10.8)

## 2020-08-18 LAB — COMPREHENSIVE METABOLIC PANEL WITH GFR
ALT: 15 IU/L (ref 0–44)
AST: 38 IU/L (ref 0–40)
Albumin/Globulin Ratio: 1.7 (ref 1.2–2.2)
Albumin: 4 g/dL (ref 3.8–4.8)
Alkaline Phosphatase: 65 IU/L (ref 48–121)
BUN/Creatinine Ratio: 17 (ref 10–24)
BUN: 23 mg/dL (ref 8–27)
Bilirubin Total: 0.4 mg/dL (ref 0.0–1.2)
CO2: 24 mmol/L (ref 20–29)
Calcium: 9.2 mg/dL (ref 8.6–10.2)
Chloride: 103 mmol/L (ref 96–106)
Creatinine, Ser: 1.34 mg/dL — ABNORMAL HIGH (ref 0.76–1.27)
GFR calc Af Amer: 62 mL/min/1.73
GFR calc non Af Amer: 54 mL/min/1.73 — ABNORMAL LOW
Globulin, Total: 2.4 g/dL (ref 1.5–4.5)
Glucose: 186 mg/dL — ABNORMAL HIGH (ref 65–99)
Potassium: 6.4 mmol/L — ABNORMAL HIGH (ref 3.5–5.2)
Sodium: 138 mmol/L (ref 134–144)
Total Protein: 6.4 g/dL (ref 6.0–8.5)

## 2020-08-18 LAB — TSH: TSH: 4.23 u[IU]/mL (ref 0.450–4.500)

## 2020-08-18 LAB — HEMOGLOBIN A1C
Est. average glucose Bld gHb Est-mCnc: 203 mg/dL
Hgb A1c MFr Bld: 8.7 % — ABNORMAL HIGH (ref 4.8–5.6)

## 2020-08-18 LAB — T4, FREE: Free T4: 1.22 ng/dL (ref 0.82–1.77)

## 2020-08-18 LAB — LIPID PANEL WITH LDL/HDL RATIO
Cholesterol, Total: 167 mg/dL (ref 100–199)
HDL: 40 mg/dL (ref >39)
LDL Chol Calc (NIH): 89 mg/dL (ref 0–99)
LDL/HDL Ratio: 2.2 ratio (ref 0.0–3.6)
Triglycerides: 227 mg/dL — ABNORMAL HIGH (ref 0–149)
VLDL Cholesterol Cal: 38 mg/dL (ref 5–40)

## 2020-08-18 LAB — T3: T3, Total: 134 ng/dL (ref 71–180)

## 2020-08-18 LAB — VITAMIN B12: Vitamin B-12: 933 pg/mL (ref 232–1245)

## 2020-08-18 LAB — FOLATE: Folate: 13.6 ng/mL

## 2020-08-18 LAB — VITAMIN D 25 HYDROXY (VIT D DEFICIENCY, FRACTURES): Vit D, 25-Hydroxy: 10.7 ng/mL — ABNORMAL LOW (ref 30.0–100.0)

## 2020-08-18 LAB — INSULIN, RANDOM: INSULIN: 18 u[IU]/mL (ref 2.6–24.9)

## 2020-08-22 NOTE — Progress Notes (Addendum)
Dear Dr. Coralyn Lawson,   Thank you for referring Gavin Lawson to our clinic. The following note includes my evaluation and treatment recommendations.  Chief Complaint:   This is the patient's first visit at [facility].  The patient's NEW PATIENT PACKET was reviewed at length. Included in the packet: current and past health history, medications, allergies, ROS, gynecologic history (women only), surgical history, family history, social history, weight history, weight loss surgery history (for those that have had weight loss surgery), nutritional evaluation, mood and food questionnaire along with a depression screening (PHQ9) on all patients, an Epworth questionnaire, sleep habits questionnaire, patient life and health improvement goals questionnaire. These will all be scanned into the patient's chart under media.   During the visit, I independently reviewed the patient's EKG, bioimpedance scale results, and indirect calorimeter results. I used this information to tailor a meal plan for the patient that will help Gavin Lawson to lose weight and will improve his obesity-related conditions going forward. I performed a medically necessary appropriate examination and/or evaluation. I discussed the assessment and treatment plan with the patient. The patient was provided an opportunity to ask questions and all were answered. The patient agreed with the plan and demonstrated an understanding of the instructions. Labs were ordered at this visit and will be reviewed at the next visit unless more critical results need to be addressed immediately. Clinical information was updated and documented in the EMR.   Time spent on visit including pre-visit chart review and post-visit care was estimated to be 60-74 minutes.  A separate 15 minutes was spent on risk counseling (see above).    OBESITY Gavin Lawson (MR# [id number]) is a 69 y.o. male who presents for evaluation and treatment of obesity and related  comorbidities. Current BMI is Body mass index is 46.38 kg/m. Gavin Lawson has been struggling with his weight for many years and has been unsuccessful in either losing weight, maintaining weight loss, or reaching his healthy weight goal.  Gavin Lawson is currently in the action stage of change and ready to dedicate time achieving and maintaining a healthier weight. Gavin Lawson is interested in becoming our patient and working on intensive lifestyle modifications including (but not limited to) diet and exercise for weight loss.  Gavin Lawson was sent to [place] by his orthopedist.  He needs to lose weight in order to have surgery.  He is a retired Personnel officer.  He lives a lone.  He finds support the most with his fellow church members.  He is in a wheelchair today, but usually uses a walker due to hip and chronic back pain.  [name]'s habits were reviewed today and are as follows: his desired weight loss is 67 pounds, he started gaining weight in his 30s, his heaviest weight ever was 375 pounds, he snacks frequently in the evenings, he skips meals frequently, he is trying to follow a vegetarian diet, he is trying to follow a vegan diet, he is frequently drinking liquids with calories, he sometimes makes poor food choices and he struggles with emotional eating.  Depression Screen Gavin Lawson's Food and Mood (modified PHQ-9) score was 4.  Depression screen PHQ 2/9 08/17/2020  Decreased Interest 3  Down, Depressed, Hopeless 3  PHQ - 2 Score 6  Altered sleeping 2  Tired, decreased energy 3  Change in appetite 1  Feeling bad or failure about yourself  0  Trouble concentrating 2  Moving slowly or fidgety/restless 0  Suicidal thoughts -  PHQ-9 Score 14  Difficult doing  work/chores -   Subjective:   1. Other fatigue Gavin Lawson admits to daytime somnolence and reports waking up still tired. Patent has a history of symptoms of daytime fatigue, morning fatigue and snoring. Gavin Lawson generally gets 6 hours of sleep per night, and states that  he has poor quality sleep. Snoring is present. Apneic episodes are present. Epworth Sleepiness Score is 14.    2. Shortness of breath on exertion Gavin Lawson notes increasing shortness of breath with exercising and seems to be worsening over time with weight gain. He notes getting out of breath sooner with activity than he used to. This has gotten worse recently. Gavin Lawson denies shortness of breath at rest or orthopnea.  3. Type 2 diabetes mellitus with other specified complication, without long-term current use of insulin (HCC) Medications reviewed. Diabetic ROS: no polyuria or polydipsia, no chest pain, dyspnea or TIA's, no numbness, tingling or pain in extremities.  A1c 7.5 with [name].  See separate sheet of blood sugar readings, which I reviewed with him.  Lab Results  Component Value Date   HGBA1C 8.7 (H) 08/17/2020   HGBA1C 9.5 (H) 09/28/2015   Lab Results  Component Value Date   LDLCALC 89 08/17/2020   CREATININE 1.34 (H) 08/17/2020   Lab Results  Component Value Date   INSULIN 18.0 08/17/2020   4. Hypertension associated with diabetes (HCC) Review: taking medications as instructed, no medication side effects noted, no chest pain on exertion, no dyspnea on exertion, no swelling of ankles.  He is on Norvasc, Demadex, Coreg, and spironolactone.  Blood pressure is much better controlled at home.  Gavin Lawson says it runs around 120s-130s/80s.  BP Readings from Last 3 Encounters:  08/17/20 (!) 152/84  03/25/20 (!) 155/74  11/19/18 (!) 190/75   5. Hyperlipidemia associated with type 2 diabetes mellitus (HCC) Gavin Lawson has hyperlipidemia and has been trying to improve his cholesterol levels with intensive lifestyle modification including a low saturated fat diet, exercise and weight loss. He denies any chest pain, claudication or myalgias.  He is on Pravachol.  Denies side effects.  Lab Results  Component Value Date   ALT 15 08/17/2020   AST 38 08/17/2020   ALKPHOS 65 08/17/2020   BILITOT  0.4 08/17/2020   Lab Results  Component Value Date   CHOL 167 08/17/2020   HDL 40 08/17/2020   LDLCALC 89 08/17/2020   TRIG 227 (H) 08/17/2020   CHOLHDL 4.1 09/28/2015   6. Anemia, unspecified type Javien is a vegetarian.  He does not have a history of weight loss surgery.   CBC Latest Ref Rng & Units 08/17/2020 03/25/2020 03/24/2020  WBC 3.4 - 10.8 x10E3/uL 7.0 6.3 7.4  Hemoglobin 13.0 - 17.7 g/dL 11.9(L) 10.8(L) 11.4(L)  Hematocrit 37.5 - 51.0 % 36.8(L) 32.7(L) 36.0(L)  Platelets 150 - 450 x10E3/uL 242 237 238   Lab Results  Component Value Date   VITAMINB12 933 08/17/2020   7. Depression screening Brandonn was screened for depression as part of his new patient workup.  PHQ-9 is 4.  Assessment/Plan:   1. Other fatigue Orvell does feel that his weight is causing his energy to be lower than it should be. Fatigue may be related to obesity, depression or many other causes. Labs will be ordered, and in the meanwhile, Antoni will focus on self care including making healthy food choices, increasing physical activity and focusing on stress reduction.  - EKG 12-Lead - T3 - T4, free - TSH - VITAMIN D 25 Hydroxy (Vit-D Deficiency, Fractures)  2. Shortness of breath on exertion Demaree does feel that he gets out of breath more easily that he used to when he exercises. Shia's shortness of breath appears to be obesity related and exercise induced. He has agreed to work on weight loss and gradually increase exercise to treat his exercise induced shortness of breath. Will continue to monitor closely.  3. Type 2 diabetes mellitus with other specified complication, without long-term current use of insulin (HCC) Good blood sugar control is important to decrease the likelihood of diabetic complications such as nephropathy, neuropathy, limb loss, blindness, coronary artery disease, and death. Intensive lifestyle modification including diet, exercise and weight loss are the first line of treatment for  diabetes.  Management per PCP.  A1c is not at goal of less than 7.0.  Prudent nutritional plan and weight loss in addition to medication management on insulin.  - Comprehensive metabolic panel - Hemoglobin A1c - Insulin, random  4. Hypertension associated with diabetes (HCC) Xeng is working on healthy weight loss and exercise to improve blood pressure control. We will watch for signs of hypotension as he continues his lifestyle modifications.  Check labs.  Blood pressure is not at goal currently.  Will continue home blood pressure monitoring.  Continue prudent nutritional plan and weight loss.  5. Hyperlipidemia associated with type 2 diabetes mellitus (HCC) Cardiovascular risk and specific lipid/LDL goals reviewed.  We discussed several lifestyle modifications today and Cotey will continue to work on diet, exercise and weight loss efforts. Orders and follow up as documented in patient record.  Check labs, continue medications, prudent nutritional plan, weight loss.   Counseling Intensive lifestyle modifications are the first line treatment for this issue. . Dietary changes: Increase soluble fiber. Decrease simple carbohydrates. . Exercise changes: Moderate to vigorous-intensity aerobic activity 150 minutes per week if tolerated. . Lipid-lowering medications: see documented in medical record. .  - Lipid Panel With LDL/HDL Ratio  6. Anemia, unspecified type Check labs, prudent nutritional plan, weight loss.  Orders and follow up as documented in patient record.  Counseling . Iron is essential for our bodies to make red blood cells.  Reasons that someone may be deficient include: an iron-deficient diet (more likely in those following vegan or vegetarian diets), women with heavy menses, patients with GI disorders or poor absorption, patients that have had bariatric surgery, frequent blood donors, patients with cancer, and patients with heart disease.   Gaspar Cola foods include dark leafy  greens, red and white meats, eggs, seafood, and beans.   . Certain foods and drinks prevent your body from absorbing iron properly. Avoid eating these foods in the same meal as iron-rich foods or with iron supplements. These foods include: coffee, black tea, and red wine; milk, dairy products, and foods that are high in calcium; beans and soybeans; whole grains.  . Constipation can be a side effect of iron supplementation. Increased water and fiber intake are helpful. Water goal: > 2 liters/day. Fiber goal: > 25 grams/day.  - Vitamin B12 - CBC with Differential/Platelet - Folate  7. Depression screening Depression screening was negative.  8. Class 3 severe obesity with serious comorbidity and body mass index (BMI) of 45.0 to 49.9 in adult, unspecified obesity type [facility]) Chazz is currently in the action stage of change and his goal is to continue with weight loss efforts. I recommend Ruhan begin the structured treatment plan as follows:  He has agreed to the Category 1 Plan.  Exercise goals: As is.   Behavioral  modification strategies: increasing lean protein intake, meal planning and cooking strategies and planning for success.  He was informed of the importance of frequent follow-up visits to maximize his success with intensive lifestyle modifications for his multiple health conditions. He was informed we would discuss his lab results at his next visit unless there is a critical issue that needs to be addressed sooner. Gavin FastRonald agreed to keep his next visit at the agreed upon time to discuss these results.  Objective:   Blood pressure (!) 152/84, height 5\' 8"  (1.727 m), weight (!) 305 lb (138.3 kg). Body mass index is 46.38 kg/m.  EKG: Normal sinus rhythm, rate 57 bpm.  Indirect Calorimeter completed today shows a VO2 of 242 and a REE of 1687.  His calculated basal metabolic rate is [id number] thus his basal metabolic rate is worse than expected.  General: Cooperative, alert, well  developed, in no acute distress. HEENT: Conjunctivae and lids unremarkable. Cardiovascular: Regular rhythm.  Lungs: Normal work of breathing. Neurologic: No focal deficits.   Lab Results  Component Value Date   CREATININE 1.34 (H) 08/17/2020   BUN 23 08/17/2020   NA 138 08/17/2020   K 6.4 (H) 08/17/2020   CL 103 08/17/2020   CO2 24 08/17/2020   Lab Results  Component Value Date   ALT 15 08/17/2020   AST 38 08/17/2020   ALKPHOS 65 08/17/2020   BILITOT 0.4 08/17/2020   Lab Results  Component Value Date   HGBA1C 8.7 (H) 08/17/2020   HGBA1C 9.5 (H) 09/28/2015   Lab Results  Component Value Date   INSULIN 18.0 08/17/2020   Lab Results  Component Value Date   TSH 4.230 08/17/2020   Lab Results  Component Value Date   CHOL 167 08/17/2020   HDL 40 08/17/2020   LDLCALC 89 08/17/2020   TRIG 227 (H) 08/17/2020   CHOLHDL 4.1 09/28/2015   Lab Results  Component Value Date   WBC 7.0 08/17/2020   HGB 11.9 (L) 08/17/2020   HCT 36.8 (L) 08/17/2020   MCV 93 08/17/2020   PLT 242 08/17/2020   Obesity Behavioral Intervention Visit:   Approximately 15 minutes were spent on the discussion below.  ASK: We discussed the diagnosis of obesity with Gavin Fastonald today and Gavin FastRonald agreed to give us permission to discuss obesity behavioral modification therapy today.  ASSESS: Gavin FastRonald has the diagnosis of obesity and his BMI today is 46.4. Gavin FastRonald is in the action stage of change.   ADVISE: Gavin FastRonald was educated on the multiple health risks of obesity as well as the benefit of weight loss to improve his health. He was advised of the need for long term treatment and the importance of lifestyle modifications to improve his current health and to decrease his risk of future health problems.  AGREE: Multiple dietary modification options and treatment options were discussed and Gavin FastRonald agreed to follow the recommendations documented in the above note.  ARRANGE: Gavin FastRonald was educated on the importance  of frequent visits to treat obesity as outlined per CMS and USPSTF guidelines and agreed to schedule his next follow up appointment today.  Attestation Statements:   Reviewed by clinician on day of visit: allergies, medications, problem list, medical history, surgical history, family history, social history, and previous encounter notes.  I, Insurance claims handlerAmber Agner, CMA, am acting as Energy managertranscriptionist for Marsh & McLennanDeborah Yexalen Deike, DO.  I have reviewed the above documentation for accuracy and completeness, and I agree with the above. Thomasene Lot-  Zaydon Kinser, DO

## 2020-08-31 ENCOUNTER — Ambulatory Visit (INDEPENDENT_AMBULATORY_CARE_PROVIDER_SITE_OTHER): Payer: Medicare Other | Admitting: Family Medicine

## 2020-08-31 ENCOUNTER — Other Ambulatory Visit: Payer: Self-pay

## 2020-08-31 ENCOUNTER — Encounter (INDEPENDENT_AMBULATORY_CARE_PROVIDER_SITE_OTHER): Payer: Self-pay | Admitting: Family Medicine

## 2020-08-31 VITALS — BP 123/58 | HR 53 | Temp 98.6°F | Ht 68.0 in | Wt 305.0 lb

## 2020-08-31 DIAGNOSIS — E559 Vitamin D deficiency, unspecified: Secondary | ICD-10-CM | POA: Diagnosis not present

## 2020-08-31 DIAGNOSIS — E875 Hyperkalemia: Secondary | ICD-10-CM

## 2020-08-31 DIAGNOSIS — I10 Essential (primary) hypertension: Secondary | ICD-10-CM

## 2020-08-31 DIAGNOSIS — E1159 Type 2 diabetes mellitus with other circulatory complications: Secondary | ICD-10-CM

## 2020-08-31 DIAGNOSIS — E538 Deficiency of other specified B group vitamins: Secondary | ICD-10-CM

## 2020-08-31 DIAGNOSIS — I152 Hypertension secondary to endocrine disorders: Secondary | ICD-10-CM

## 2020-08-31 DIAGNOSIS — E785 Hyperlipidemia, unspecified: Secondary | ICD-10-CM

## 2020-08-31 DIAGNOSIS — Z6841 Body Mass Index (BMI) 40.0 and over, adult: Secondary | ICD-10-CM | POA: Diagnosis not present

## 2020-08-31 DIAGNOSIS — I2510 Atherosclerotic heart disease of native coronary artery without angina pectoris: Secondary | ICD-10-CM

## 2020-08-31 DIAGNOSIS — E1169 Type 2 diabetes mellitus with other specified complication: Secondary | ICD-10-CM

## 2020-08-31 MED ORDER — VITAMIN D (ERGOCALCIFEROL) 1.25 MG (50000 UNIT) PO CAPS: 50000.0000 [IU] | ORAL_CAPSULE | ORAL | 0 refills | Status: DC

## 2020-09-05 NOTE — Progress Notes (Signed)
Chief Complaint:   OBESITY Gavin Lawson is here to discuss his progress with his obesity treatment plan along with follow-up of his obesity related diagnoses. Gavin Lawson is on the Category 1 Plan and states he is following his eating plan approximately 20% of the time. Gavin Lawson states he is exercising for 0 minutes 0 times per week.  Today's visit was #: 2 Starting weight: 305 lbs Starting date: 08/17/2020 Today's weight: 305 lbs Today's date: 08/31/2020 Total lbs lost to date: 0 Total lbs lost since last in-office visit: 0  Interim History: Gavin Lawson says he is unable to stand and cook for himself.  He does grocery shopping, but has not gone since his first office visit with Gavin Lawson.  He has started eating more eggs and tried to cut out snacks.  Those are the two changes to his diet that he has made.  He eats out a lot and goes through a lot of drive-thrus.  His friend owns a [employer] restaurant.  He likes to eat ribs.  Subjective:   1. Hypertension associated with diabetes (HCC) Review: taking medications as instructed, no medication side effects noted, no chest pain on exertion, no dyspnea on exertion, no swelling of ankles.  He did not check his blood pressure at home.  But today is better than at last office visit.  He is on Norvasc, Coreg, lisinopril, aldactone, and Demadex.   BP Readings from Last 3 Encounters:  08/31/20 (!) 123/58  08/17/20 (!) 152/84  03/25/20 (!) 155/74   2. Hyperlipidemia associated with type 2 diabetes mellitus (HCC) Gavin Lawson has hyperlipidemia and has been trying to improve his cholesterol levels with intensive lifestyle modification including a low saturated fat diet, exercise and weight loss. He denies any chest pain, claudication or myalgias.  Elevated triglycerides to 227.  On pravastatin and tolerating it well.  LDL > 70.  Lab Results  Component Value Date   ALT 15 08/17/2020   AST 38 08/17/2020   ALKPHOS 65 08/17/2020   BILITOT 0.4 08/17/2020   Lab Results    Component Value Date   CHOL 167 08/17/2020   HDL 40 08/17/2020   LDLCALC 89 08/17/2020   TRIG 227 (H) 08/17/2020   CHOLHDL 4.1 09/28/2015   3. Type 2 diabetes mellitus with other specified complication, without long-term current use of insulin (HCC) Medications reviewed. Diabetic ROS: no polyuria or polydipsia, no chest pain, dyspnea or TIA's, no numbness, tingling or pain in extremities.  A1c is elevated.  PCP started him on various GLP-1 about 3 months ago, and his insurance would not cover them.  He has history of pancreatitis.  [facility], A1c was 7.5.  It has recently elevated.  Lab Results  Component Value Date   HGBA1C 8.7 (H) 08/17/2020   HGBA1C 9.5 (H) 09/28/2015   Lab Results  Component Value Date   LDLCALC 89 08/17/2020   CREATININE 1.34 (H) 08/17/2020   Lab Results  Component Value Date   INSULIN 18.0 08/17/2020   4. Vitamin D deficiency Gavin Lawson's Vitamin D level was 10.7 on 08/17/2020. He is currently taking prescription vitamin D 50,000 IU each week. He denies nausea, vomiting or muscle weakness.  He endorses fatigue and tiredness.  5. Vitamin B12 deficiency He notes fatigue. He is not a vegetarian.  He does not have a previous diagnosis of pernicious anemia.  He does not have a history of weight loss surgery.  He is taking 1000 mcg daily OTC.  Lab Results  Component  Value Date   VITAMINB12 933 08/17/2020   6. Hyperkalemia Denies symptoms.  Potassium 6.4.  He eats a lot of greens and bananas.  Possibly taking potassium supplement.   Assessment/Plan:   1. Hypertension associated with diabetes (HCC) Gavin Lawson is working on healthy weight loss and exercise to improve blood pressure control. We will watch for signs of hypotension as he continues his lifestyle modifications.  Blood pressure is at goal.  Continue prudent nutritional plan, weight loss.  Continue medications per PCP.  Home blood pressure monitoring recommended.  2. Hyperlipidemia associated with  type 2 diabetes mellitus (HCC) Cardiovascular risk and specific lipid/LDL goals reviewed.  We discussed several lifestyle modifications today and Gavin Lawson will continue to work on diet, exercise and weight loss efforts. Orders and follow up as documented in patient record.  LDL not at goal of <70 but close. Gavin Lawson is to work on improving diet (decrease saturated/trans fats) and continue medications.  Recheck labs in 3-4 months.  Counseling Intensive lifestyle modifications are the first line treatment for this issue.  Dietary changes: Increase soluble fiber. Decrease simple carbohydrates.  Exercise changes: Moderate to vigorous-intensity aerobic activity 150 minutes per week if tolerated.  Lipid-lowering medications: see documented in medical record.  3. Type 2 diabetes mellitus with other specified complication, without long-term current use of insulin (HCC) Discussed labs with patient today.  Good blood sugar control is important to decrease the likelihood of diabetic complications such as nephropathy, neuropathy, limb loss, blindness, coronary artery disease, and death. Intensive lifestyle modification including diet, exercise and weight loss are the first line of treatment for diabetes.  A1c is elevated from prior.  He has an appointment with his PCP in 1-2 weeks and he will discuss with this PCP any change in medications.  He says he can only afford insulin and cannot tolerate metformin.  4. Vitamin D deficiency Discussed labs with patient today.  Low Vitamin D level contributes to fatigue and are associated with obesity, breast, and colon cancer. He agrees to start to take prescription Vitamin D @50 ,000 IU every week and will follow-up for routine testing of Vitamin D, at least 2-3 times per year to avoid over-replacement.  Recheck level in about 3 months.  Extensive education done.  -Start Vitamin D, Ergocalciferol, (DRISDOL) 1.25 MG (50000 UNIT) CAPS capsule; Take 1 capsule (50,000 Units total)  by mouth every 7 (seven) days.  Dispense: 4 capsule; Refill: 0  5. Vitamin B12 deficiency The diagnosis was reviewed with the patient. Counseling provided today, see below. We will continue to monitor. Orders and follow up as documented in patient record.  Continue as levels are within normal limits.  Counseling  The body needs vitamin B12: to make red blood cells; to make DNA; and to help the nerves work properly so they can carry messages from the brain to the body.   The main causes of vitamin B12 deficiency include dietary deficiency, digestive diseases, pernicious anemia, and having a surgery in which part of the stomach or small intestine is removed.   Certain medicines can make it harder for the body to absorb vitamin B12. These medicines include: heartburn medications; some antibiotics; some medications used to treat diabetes, gout, and high cholesterol.   In some cases, there are no symptoms of this condition. If the condition leads to anemia or nerve damage, various symptoms can occur, such as weakness or fatigue, shortness of breath, and numbness or tingling in your hands and feet.    Treatment:  o  May include taking vitamin B12 supplements.  o Avoid alcohol.  o Eat lots of healthy foods that contain vitamin B12: - Beef, pork, chicken, [place], and organ meats, such as liver.  - Seafood: This includes clams, rainbow trout, salmon, tuna, and haddock. Eggs.  - Cereal and dairy products that are fortified: This means that vitamin B12 has been added to the food.   6. Hyperkalemia Recheck labs in 2 weeks with [place] or PCP.  Needs to cut out potassium rich foods such as potatoes, bananas, etc., which he tells me he has been eating a lot of.  Continue torsemide per PCP and follow-up with them in 2 weeks for labs or we will repeat labs.  Stop any potassium supplement, which he is unsure if he is taking.  He was on them at one point in the past.  7. Class 3 severe obesity with serious  comorbidity and body mass index (BMI) of 45.0 to 49.9 in adult, unspecified obesity type Gavin Lawson) Gavin Lawson is currently in the action stage of change. As such, his goal is to continue with weight loss efforts. He has agreed to practicing portion control and making smarter food choices, such as increasing vegetables and decreasing simple carbohydrates.   Exercise goals: As is.  Behavioral modification strategies: increasing lean protein intake, decreasing simple carbohydrates, decreasing liquid calories, meal planning and cooking strategies, keeping healthy foods in the home and planning for success.  Gavin Lawson has agreed to follow-up with our clinic in 2 weeks. He was informed of the importance of frequent follow-up visits to maximize his success with intensive lifestyle modifications for his multiple health conditions.   Objective:   Blood pressure (!) 123/58, pulse (!) 53, temperature 98.6 F (37 C), height 5\' 8"  (1.727 m), weight (!) 305 lb (138.3 kg), SpO2 98 %. Body mass index is 46.38 kg/m.  General: Cooperative, alert, well developed, in no acute distress. HEENT: Conjunctivae and lids unremarkable. Cardiovascular: Regular rhythm.  Lungs: Normal work of breathing. Neurologic: No focal deficits.   Lab Results  Component Value Date   CREATININE 1.34 (H) 08/17/2020   BUN 23 08/17/2020   NA 138 08/17/2020   K 6.4 (H) 08/17/2020   CL 103 08/17/2020   CO2 24 08/17/2020   Lab Results  Component Value Date   ALT 15 08/17/2020   AST 38 08/17/2020   ALKPHOS 65 08/17/2020   BILITOT 0.4 08/17/2020   Lab Results  Component Value Date   HGBA1C 8.7 (H) 08/17/2020   HGBA1C 9.5 (H) 09/28/2015   Lab Results  Component Value Date   INSULIN 18.0 08/17/2020   Lab Results  Component Value Date   TSH 4.230 08/17/2020   Lab Results  Component Value Date   CHOL 167 08/17/2020   HDL 40 08/17/2020   LDLCALC 89 08/17/2020   TRIG 227 (H) 08/17/2020   CHOLHDL 4.1 09/28/2015   Lab Results    Component Value Date   WBC 7.0 08/17/2020   HGB 11.9 (L) 08/17/2020   HCT 36.8 (L) 08/17/2020   MCV 93 08/17/2020   PLT 242 08/17/2020   Obesity Behavioral Intervention:   Approximately 15 minutes were spent on the discussion below.  ASK: We discussed the diagnosis of obesity with 10/17/2020 today and Tanner agreed to give Gavin Lawson permission to discuss obesity behavioral modification therapy today.  ASSESS: Tim has the diagnosis of obesity and his BMI today is 46.38. Juanpablo is in the action stage of change.   ADVISE: Robertlee was educated on  the multiple health risks of obesity as well as the benefit of weight loss to improve his health. He was advised of the need for long term treatment and the importance of lifestyle modifications to improve his current health and to decrease his risk of future health problems.  AGREE: Multiple dietary modification options and treatment options were discussed and Tyreik agreed to follow the recommendations documented in the above note.  ARRANGE: Deng was educated on the importance of frequent visits to treat obesity as outlined per CMS and USPSTF guidelines and agreed to schedule his next follow up appointment today.  Attestation Statements:   Reviewed by clinician on day of visit: allergies, medications, problem list, medical history, surgical history, family history, social history, and previous encounter notes.  I, Insurance claims handler, CMA, am acting as Energy manager for [employer], DO.  I have reviewed the above documentation for accuracy and completeness, and I agree with the above. -  Thomasene Lot, DO

## 2020-09-13 ENCOUNTER — Encounter (INDEPENDENT_AMBULATORY_CARE_PROVIDER_SITE_OTHER): Payer: Self-pay | Admitting: Family Medicine

## 2020-09-13 ENCOUNTER — Other Ambulatory Visit: Payer: Self-pay

## 2020-09-13 ENCOUNTER — Telehealth (INDEPENDENT_AMBULATORY_CARE_PROVIDER_SITE_OTHER): Payer: Medicare Other | Admitting: Family Medicine

## 2020-09-13 DIAGNOSIS — E559 Vitamin D deficiency, unspecified: Secondary | ICD-10-CM

## 2020-09-13 DIAGNOSIS — Z6841 Body Mass Index (BMI) 40.0 and over, adult: Secondary | ICD-10-CM | POA: Diagnosis not present

## 2020-09-13 DIAGNOSIS — I2510 Atherosclerotic heart disease of native coronary artery without angina pectoris: Secondary | ICD-10-CM | POA: Diagnosis not present

## 2020-09-14 NOTE — Progress Notes (Signed)
TeleHealth Visit:  Due to the COVID-19 pandemic, this visit was completed with telemedicine (audio/video) technology to reduce patient and provider exposure as well as to preserve personal protective equipment.   Gavin Lawson has verbally consented to this TeleHealth visit. The patient is located at home, the provider is located at the [facility] office. The participants in this visit include the listed provider and patient.  The visit was conducted today via telephone due to pt not having ability to connect via video due to technical difficulties.  Chief Complaint: OBESITY Gavin Lawson is here to discuss his progress with his obesity treatment plan along with follow-up of his obesity related diagnoses. Gavin Lawson is on the Category 1 Plan and states he is following his eating plan approximately 75% of the time. Gavin Lawson states he is exercising for 0 minutes 0 times per week.  Today's visit was #: 3 Starting weight: 305 lbs Starting date: 08/17/2020  Interim History:   Gavin Lawson is sick and unable to come in because since yesterday he has felt sick.  Two days ago he recently saw his primary care provider via a telehealth visit for unilateral lower extremity edema of the right leg. He is scheduled to have Doppler ultrasounds.   Additionally, he has been having chest pain, shortness of breath, pain everywhere. He went to see his Cardiologist yesterday and they "did tests and found nothing wrong" per pt.   He has not been weighing meats or proteins at all.    For breakfast, he eats a [employer] double egg sandwich daily, lunch: grilled chicken with vegetables -usually pinto beans or black eyed peas.  For dinner, [place] summer rolls in rice wrap with 1-2 pieces of shrimp.  He is not really following the plan at all.   Subjective:   1. Vitamin D deficiency Gavin Lawson's Vitamin D level was 10.7 on 08/17/2020. He is currently taking prescription vitamin D 50,000 IU each week. He denies nausea, vomiting or  muscle weakness.  Tolerating well.  Started at last office visit.   Assessment/Plan:   1. Vitamin D deficiency Low Vitamin D level contributes to fatigue and are associated with obesity, breast, and colon cancer. He agrees to continue to take prescription Vitamin D @50 ,000 IU every week and will follow-up for routine testing of Vitamin D, at least 2-3 times per year to avoid over-replacement.  Continue medication, continue prudent nutritional plan with goal of weight loss.  Recheck in 2-3 months.   2. Class 3 severe obesity with serious comorbidity and body mass index (BMI) of 45.0 to 49.9 in adult, unspecified obesity type Gavin Lawson)  Gavin Lawson is currently in the action stage of change. As such, his goal is to continue with weight loss efforts. He has agreed to the Category 1 Plan.  Continue same as he is not in office today and I cannot give him different handouts, etc.  Also, he does not want to journal or have to write things down.  Exercise goals: As is.  Behavioral modification strategies: increasing lean protein intake, decreasing simple carbohydrates, decreasing eating out, no skipping meals, meal planning and cooking strategies and keeping healthy foods in the home.  Gavin Lawson has agreed to follow-up with our clinic in 2 weeks. He was informed of the importance of frequent follow-up visits to maximize his success with intensive lifestyle modifications for his multiple health conditions.  Objective:   VITALS: Per patient if applicable, see vitals. GENERAL: Alert and in no acute distress. CARDIOPULMONARY: No increased WOB. Speaking  in clear sentences.  PSYCH: Pleasant and cooperative. Speech normal rate and rhythm. Affect is appropriate. Insight and judgement are appropriate. Attention is focused, linear, and appropriate.  NEURO: Oriented as arrived to appointment on time with no prompting.   Lab Results  Component Value Date   CREATININE 1.34 (H) 08/17/2020   BUN 23 08/17/2020   NA 138  08/17/2020   K 6.4 (H) 08/17/2020   CL 103 08/17/2020   CO2 24 08/17/2020   Lab Results  Component Value Date   ALT 15 08/17/2020   AST 38 08/17/2020   ALKPHOS 65 08/17/2020   BILITOT 0.4 08/17/2020   Lab Results  Component Value Date   HGBA1C 8.7 (H) 08/17/2020   HGBA1C 9.5 (H) 09/28/2015   Lab Results  Component Value Date   INSULIN 18.0 08/17/2020   Lab Results  Component Value Date   TSH 4.230 08/17/2020   Lab Results  Component Value Date   CHOL 167 08/17/2020   HDL 40 08/17/2020   LDLCALC 89 08/17/2020   TRIG 227 (H) 08/17/2020   CHOLHDL 4.1 09/28/2015   Lab Results  Component Value Date   WBC 7.0 08/17/2020   HGB 11.9 (L) 08/17/2020   HCT 36.8 (L) 08/17/2020   MCV 93 08/17/2020   PLT 242 08/17/2020   Attestation Statements:   Reviewed by clinician on day of visit: allergies, medications, problem list, medical history, surgical history, family history, social history, and previous encounter notes.  I, Gavin Lawson, CMA, am acting as Energy manager for [employer], DO.  I have reviewed the above documentation for accuracy and completeness, and I agree with the above. -  Thomasene Lot, DO

## 2020-10-02 ENCOUNTER — Other Ambulatory Visit: Payer: Self-pay

## 2020-10-02 ENCOUNTER — Ambulatory Visit (INDEPENDENT_AMBULATORY_CARE_PROVIDER_SITE_OTHER): Payer: Medicare Other | Admitting: Family Medicine

## 2020-10-02 VITALS — BP 162/70 | HR 64 | Temp 97.7°F | Ht 68.0 in | Wt 308.0 lb

## 2020-10-02 DIAGNOSIS — I2510 Atherosclerotic heart disease of native coronary artery without angina pectoris: Secondary | ICD-10-CM | POA: Diagnosis not present

## 2020-10-02 DIAGNOSIS — I152 Hypertension secondary to endocrine disorders: Secondary | ICD-10-CM

## 2020-10-02 DIAGNOSIS — E559 Vitamin D deficiency, unspecified: Secondary | ICD-10-CM

## 2020-10-02 DIAGNOSIS — E1159 Type 2 diabetes mellitus with other circulatory complications: Secondary | ICD-10-CM

## 2020-10-02 DIAGNOSIS — Z6841 Body Mass Index (BMI) 40.0 and over, adult: Secondary | ICD-10-CM | POA: Diagnosis not present

## 2020-10-02 MED ORDER — VITAMIN D (ERGOCALCIFEROL) 1.25 MG (50000 UNIT) PO CAPS: 50000.0000 [IU] | ORAL_CAPSULE | ORAL | 0 refills | Status: DC

## 2020-10-04 NOTE — Progress Notes (Signed)
Chief Complaint:   OBESITY Gavin Lawson is here to discuss his progress with his obesity treatment plan along with follow-up of his obesity related diagnoses. Gavin Lawson is on the Category 1 Plan and states he is following his eating plan approximately 60% of the time. Gavin Lawson states he is walking whenever he can, but not for a specific amount of time 7 times per week.  Today's visit was #: 3 Starting weight: 305 lbs Starting date: 08/17/2020 Today's weight: 308 lbs Today's date: 10/02/2020 Total lbs lost to date: +3 lbs Total lbs lost since last in-office visit: +3 lbs  Interim History: Gavin Lawson says he went to a family picnic yesterday, but ate off plan.  He is not weighing foods.  It is 2:47 pm, and he has not eaten yet today.  Eats out of the home everyday for lunch and dinner.  He has turnip greens and pinto beans as a side with a meat for dinner.  He eats at [facility] for breakfast and will have eggs, grits, and ham, for example.  He does not weigh or measure vegetables.  Assessment/Plan:   1. Vitamin D deficiency Gavin Lawson has a history of Vitamin D deficiency with resultant generalized fatigue as his primary symptom.  he is taking vitamin D 50,000 IU weekly for this deficiency and tolerating it well without side-effect.   Most recent Vitamin D lab reviewed-  level: 10.7.  Plan:   - Discussed importance of vitamin D (as well as calcium) to their health and well-being.   - We reviewed possible symptoms of low Vitamin D including low energy, depressed mood, muscle aches, joint aches, osteoporosis etc.  - We discussed that low Vitamin D levels may be linked to an increased risk of cardiovascular events and even increased risk of cancers- such as colon and breast.   - Educated pt that weight loss will likely improve availability of vitamin D, thus encouraged Gavin Lawson to continue with meal plan and their weight loss efforts to further improve this condition  - I recommend pt take a weekly  prescription vit D- see script below- which pt agrees to after discussion of risks and benefits of this medication.      - Informed patient this may be a lifelong thing, and he was encouraged to continue to take the medicine until pt told otherwise.   We will need to monitor levels regularly ( q 3-4 mo on average )  to keep levels within normal limits.   - All pt's questions and concerns regarding this condition addressed  -Refill Vitamin D, Ergocalciferol, (DRISDOL) 1.25 MG (50000 UNIT) CAPS capsule; Take 1 capsule (50,000 Units total) by mouth every 7 (seven) days.  Dispense: 4 capsule; Refill: 0  2. Hypertension associated with diabetes (HCC) He has not taken his blood pressure medication yet today and does not check at home at all.  He is taking Norvasc 10 mg daily, Coreg 25 mg twice daily, and lisinopril 10 mg daily.  Plan:  Recheck in the office today.  Take medication when he gets home.  Get a home blood pressure monitor and check daily.  Goal is <140/90 due to diabetes.  BP Readings from Last 3 Encounters:  10/02/20 (!) 162/70  08/31/20 (!) 123/58  08/17/20 (!) 152/84   3. Class 3 severe obesity with serious comorbidity and body mass index (BMI) of 45.0 to 49.9 in adult, unspecified obesity type Gavin Lawson)  Gavin Lawson is currently in the action stage of change. As such, his  goal is to continue with weight loss efforts. He has agreed to the Category 1 Plan.  Explained the importance of portion control and adequate intake.  Encouraged to eat at home and weigh and measure foods.  Exercise goals: As is.  Behavioral modification strategies: increasing lean protein intake, decreasing simple carbohydrates, decreasing eating out and keeping healthy foods in the home.  Gavin Lawson has agreed to follow-up with our clinic in 2-3 weeks. He was informed of the importance of frequent follow-up visits to maximize his success with intensive lifestyle modifications for his multiple health conditions.    Objective:   Blood pressure (!) 162/70, pulse 64, temperature 97.7 F (36.5 C), height 5\' 8"  (1.727 m), weight (!) 308 lb (139.7 kg), SpO2 96 %. Body mass index is 46.83 kg/m.  General: Cooperative, alert, well developed, in no acute distress. HEENT: Conjunctivae and lids unremarkable. Cardiovascular: Regular rhythm.  Lungs: Normal work of breathing. Neurologic: No focal deficits.   Lab Results  Component Value Date   CREATININE 1.34 (H) 08/17/2020   BUN 23 08/17/2020   NA 138 08/17/2020   K 6.4 (H) 08/17/2020   CL 103 08/17/2020   CO2 24 08/17/2020   Lab Results  Component Value Date   ALT 15 08/17/2020   AST 38 08/17/2020   ALKPHOS 65 08/17/2020   BILITOT 0.4 08/17/2020   Lab Results  Component Value Date   HGBA1C 8.7 (H) 08/17/2020   HGBA1C 9.5 (H) 09/28/2015   Lab Results  Component Value Date   INSULIN 18.0 08/17/2020   Lab Results  Component Value Date   TSH 4.230 08/17/2020   Lab Results  Component Value Date   CHOL 167 08/17/2020   HDL 40 08/17/2020   LDLCALC 89 08/17/2020   TRIG 227 (H) 08/17/2020   CHOLHDL 4.1 09/28/2015   Lab Results  Component Value Date   WBC 7.0 08/17/2020   HGB 11.9 (L) 08/17/2020   HCT 36.8 (L) 08/17/2020   MCV 93 08/17/2020   PLT 242 08/17/2020   Obesity Behavioral Intervention:   Approximately 15 minutes were spent on the discussion below.  ASK: We discussed the diagnosis of obesity with 10/17/2020 today and Gavin Lawson agreed to give Gavin Lawson permission to discuss obesity behavioral modification therapy today.  ASSESS: Gavin Lawson has the diagnosis of obesity and his BMI today is 46.9. Gavin Lawson is in the action stage of change.   ADVISE: Gavin Lawson was educated on the multiple health risks of obesity as well as the benefit of weight loss to improve his health. He was advised of the need for long term treatment and the importance of lifestyle modifications to improve his current health and to decrease his risk of future health  problems.  AGREE: Multiple dietary modification options and treatment options were discussed and [name] agreed to follow the recommendations documented in the above note.  ARRANGE: Gerrard was educated on the importance of frequent visits to treat obesity as outlined per CMS and USPSTF guidelines and agreed to schedule his next follow up appointment today.  Attestation Statements:   Reviewed by clinician on day of visit: allergies, medications, problem list, medical history, surgical history, family history, social history, and previous encounter notes.  I, Gavin Lawson, CMA, am acting as Insurance claims handler for Energy manager, DO.  I have reviewed the above documentation for accuracy and completeness, and I agree with the above. [employer], D.O.  The 21st Century Cures Act was signed into law in 2016 which includes the topic of  electronic health records.  This provides immediate access to information in MyChart.  This includes consultation notes, operative notes, office notes, lab results and pathology reports.  If you have any questions about what you read please let us know at your next visit so we can discuss your concerns and take corrective action if need be.  We are right here with you.

## 2020-10-18 ENCOUNTER — Ambulatory Visit (INDEPENDENT_AMBULATORY_CARE_PROVIDER_SITE_OTHER): Payer: Medicare Other | Admitting: Family Medicine

## 2020-10-18 ENCOUNTER — Other Ambulatory Visit: Payer: Self-pay

## 2020-10-18 ENCOUNTER — Encounter (INDEPENDENT_AMBULATORY_CARE_PROVIDER_SITE_OTHER): Payer: Self-pay | Admitting: Family Medicine

## 2020-10-18 VITALS — BP 129/76 | HR 58 | Temp 97.8°F | Ht 68.0 in | Wt 301.0 lb

## 2020-10-18 DIAGNOSIS — I2510 Atherosclerotic heart disease of native coronary artery without angina pectoris: Secondary | ICD-10-CM

## 2020-10-18 DIAGNOSIS — I10 Essential (primary) hypertension: Secondary | ICD-10-CM | POA: Insufficient documentation

## 2020-10-18 DIAGNOSIS — Z6841 Body Mass Index (BMI) 40.0 and over, adult: Secondary | ICD-10-CM

## 2020-10-19 NOTE — Progress Notes (Signed)
Chief Complaint:   OBESITY Atharva is here to discuss his progress with his obesity treatment plan along with follow-up of his obesity related diagnoses. Kiyaan is on the Category 1 Plan and states he is following his eating plan approximately 90% of the time. Khyle states he is walking for 5-20 minutes 7 times per week.  Today's visit was #: 4 Starting weight: 305 lbs Starting date: 08/17/2020 Today's weight: 301 lbs Today's date: 10/18/2020 Total lbs lost to date: 4 lbs Total lbs lost since last in-office visit: 7 lbs Total weight loss percentage to date: -1.31%  Interim History: Khayman had days where he "followed the plan exactly", however, he eats one drive-thru meal and one sit down meal everyday.  He has been more active.  Not weighing proteins or measuring vegetables.  For breakfast, 3 eggs scrambled on a hamburger bun.  He goes to [employer] and gets a biscuit.  Assessment/Plan:   1. Essential hypertension Blood pressure at last office visit was 162/70.  This time, it is better.  He does not check his blood pressure at home and does not want to.  Plan:  Blood pressure is at goal today.  Continue weight loss, prudent nutritional plan, and medications per specialist.  BP Readings from Last 3 Encounters:  10/18/20 129/76  10/02/20 (!) 162/70  08/31/20 (!) 123/58     2. Class 3 severe obesity with serious comorbidity and body mass index (BMI) of 45.0 to 49.9 in adult, unspecified obesity type [facility])  Zelig is currently in the action stage of change. As such, his goal is to continue with weight loss efforts. He has agreed to the Category 1 Plan.   Exercise goals: As is.  Behavioral modification strategies: increasing lean protein intake, decreasing eating out, no skipping meals, keeping healthy foods in the home and planning for success.  Juanluis has agreed to follow-up with our clinic in 2-3 weeks. He was informed of the importance of frequent follow-up visits to maximize  his success with intensive lifestyle modifications for his multiple health conditions.   Objective:   Blood pressure 129/76, pulse (!) 58, temperature 97.8 F (36.6 C), height 5\' 8"  (1.727 m), weight (!) 301 lb (136.5 kg), SpO2 97 %. Body mass index is 45.77 kg/m.  General: Cooperative, alert, well developed, in no acute distress. HEENT: Conjunctivae and lids unremarkable. Cardiovascular: Regular rhythm.  Lungs: Normal work of breathing. Neurologic: No focal deficits.   Lab Results  Component Value Date   CREATININE 1.34 (H) 08/17/2020   BUN 23 08/17/2020   NA 138 08/17/2020   K 6.4 (H) 08/17/2020   CL 103 08/17/2020   CO2 24 08/17/2020   Lab Results  Component Value Date   ALT 15 08/17/2020   AST 38 08/17/2020   ALKPHOS 65 08/17/2020   BILITOT 0.4 08/17/2020   Lab Results  Component Value Date   HGBA1C 8.7 (H) 08/17/2020   HGBA1C 9.5 (H) 09/28/2015   Lab Results  Component Value Date   INSULIN 18.0 08/17/2020   Lab Results  Component Value Date   TSH 4.230 08/17/2020   Lab Results  Component Value Date   CHOL 167 08/17/2020   HDL 40 08/17/2020   LDLCALC 89 08/17/2020   TRIG 227 (H) 08/17/2020   CHOLHDL 4.1 09/28/2015   Lab Results  Component Value Date   WBC 7.0 08/17/2020   HGB 11.9 (L) 08/17/2020   HCT 36.8 (L) 08/17/2020   MCV 93 08/17/2020   PLT  242 08/17/2020   Obesity Behavioral Intervention:   Approximately 15 minutes were spent on the discussion below.  ASK: We discussed the diagnosis of obesity with [name] today and Jerick agreed to give [place] permission to discuss obesity behavioral modification therapy today.  ASSESS: Talmadge has the diagnosis of obesity and his BMI today is 45.9. Devlon is in the action stage of change.   ADVISE: Iwao was educated on the multiple health risks of obesity as well as the benefit of weight loss to improve his health. He was advised of the need for long term treatment and the importance of lifestyle  modifications to improve his current health and to decrease his risk of future health problems.  AGREE: Multiple dietary modification options and treatment options were discussed and Theresa agreed to follow the recommendations documented in the above note.  ARRANGE: Adith was educated on the importance of frequent visits to treat obesity as outlined per CMS and USPSTF guidelines and agreed to schedule his next follow up appointment today.  Attestation Statements:   Reviewed by clinician on day of visit: allergies, medications, problem list, medical history, surgical history, family history, social history, and previous encounter notes.  I, Insurance claims handler, CMA, am acting as Energy manager for [employer], DO.  I have reviewed the above documentation for accuracy and completeness, and I agree with the above. Carlye Grippe, D.O.  The 21st Century Cures Act was signed into law in 2016 which includes the topic of electronic health records.  This provides immediate access to information in MyChart.  This includes consultation notes, operative notes, office notes, lab results and pathology reports.  If you have any questions about what you read please let us know at your next visit so we can discuss your concerns and take corrective action if need be.  We are right here with you.

## 2020-10-31 ENCOUNTER — Encounter (INDEPENDENT_AMBULATORY_CARE_PROVIDER_SITE_OTHER): Payer: Self-pay | Admitting: Adult Health

## 2020-10-31 ENCOUNTER — Other Ambulatory Visit: Payer: Self-pay

## 2020-10-31 ENCOUNTER — Ambulatory Visit (INDEPENDENT_AMBULATORY_CARE_PROVIDER_SITE_OTHER): Payer: Medicare Other | Admitting: Adult Health

## 2020-10-31 VITALS — BP 120/65 | HR 66 | Temp 98.1°F | Ht 68.0 in | Wt 298.0 lb

## 2020-10-31 DIAGNOSIS — I10 Essential (primary) hypertension: Secondary | ICD-10-CM

## 2020-10-31 DIAGNOSIS — Z6841 Body Mass Index (BMI) 40.0 and over, adult: Secondary | ICD-10-CM | POA: Diagnosis not present

## 2020-10-31 DIAGNOSIS — E559 Vitamin D deficiency, unspecified: Secondary | ICD-10-CM

## 2020-10-31 DIAGNOSIS — G8929 Other chronic pain: Secondary | ICD-10-CM

## 2020-10-31 DIAGNOSIS — I2510 Atherosclerotic heart disease of native coronary artery without angina pectoris: Secondary | ICD-10-CM

## 2020-10-31 DIAGNOSIS — E1169 Type 2 diabetes mellitus with other specified complication: Secondary | ICD-10-CM

## 2020-10-31 MED ORDER — VITAMIN D (ERGOCALCIFEROL) 1.25 MG (50000 UNIT) PO CAPS: 50000.0000 [IU] | ORAL_CAPSULE | ORAL | 0 refills | Status: DC

## 2020-11-02 DIAGNOSIS — G8929 Other chronic pain: Secondary | ICD-10-CM | POA: Insufficient documentation

## 2020-11-02 DIAGNOSIS — E559 Vitamin D deficiency, unspecified: Secondary | ICD-10-CM | POA: Insufficient documentation

## 2020-11-02 NOTE — Progress Notes (Signed)
Chief Complaint:   OBESITY Gavin Lawson is here to discuss his progress with his obesity treatment plan along with follow-up of his obesity related diagnoses. Gavin Lawson is on the Category 1 Plan and states he is following his eating plan approximately 85% of the time. Gavin Lawson states he is not doing regular exercise at this time.  Today's visit was #: 5 Starting weight: 305 lbs Starting date: 08/17/2020 Today's weight: 298 lbs Today's date: 10/31/2020 Total lbs lost to date: 7 lbs Total lbs lost since last in-office visit: 3 lbs  Interim History: Gavin Lawson was seen by his PCP/Dr. Renato Shin, on 10/27/2020.  Changes to her antihypertensive therapy were made.  Blood pressure and heart rate are excellent today.  He typically consumes one meal a day with a lean protein (grilled chicken or grilled flounder) with vegetables.  Subjective:   1. Vitamin D deficiency Gavin Lawson's Vitamin D level was 10.7 on 08/17/2020. He is currently taking prescription vitamin D 50,000 IU each week. He denies nausea, vomiting or muscle weakness.  On 10/27/2020, vitamin D level at [facility] was 29, which is below the goal of 50.  2. Essential hypertension Review: taking medications as instructed, no medication side effects noted, no chest pain on exertion, no dyspnea on exertion, no swelling of ankles.  PCP recently stopped amlodipine due to lower extremity edema.   PCP started Imdur 30 mg daily, hydralazine 25 mg three times daily.  Continued on carvedilol.  Blood pressure and heart rate are excellent in the office today.  BP Readings from Last 3 Encounters:  10/31/20 120/65  10/18/20 129/76  10/02/20 (!) 162/70   3. Other chronic pain Lower back and bilateral hip pain.  Managed [facility] clinic at [facility].  He is on oxycodone 7.5/325 mg twice daily as needed.  Denies medication side effect. PDMP reviewed- narcotics being refilled Q4weeks.  4. Type 2 diabetes mellitus with other specified complication, without  long-term current use of insulin (HCC) On 08/17/2020, ambulatory fasting blood glucose was <200.  He denies episodes of hypoglycemia.  He is on Novolin NPH 12 units twice daily with meals.  Lab Results  Component Value Date   HGBA1C 8.7 (H) 08/17/2020   HGBA1C 9.5 (H) 09/28/2015   Lab Results  Component Value Date   LDLCALC 89 08/17/2020   CREATININE 1.34 (H) 08/17/2020   Lab Results  Component Value Date   INSULIN 18.0 08/17/2020   Assessment/Plan:   1. Vitamin D deficiency Low Vitamin D level contributes to fatigue and are associated with obesity, breast, and colon cancer. He agrees to continue to take prescription Vitamin D @50 ,000 IU every week and will follow-up for routine testing of Vitamin D, at least 2-3 times per year to avoid over-replacement.    -Refill Vitamin D, Ergocalciferol, (DRISDOL) 1.25 MG (50000 UNIT) CAPS capsule; Take 1 capsule (50,000 Units total) by mouth every 7 (seven) days.  Dispense: 4 capsule; Refill: 0  2. Essential hypertension Gavin Lawson is working on healthy weight loss and exercise to improve blood pressure control. We will watch for signs of hypotension as he continues his lifestyle modifications.  Continue current antihypertensives.  3. Other chronic pain Continue close follow-up with the pain clinic per policy.  4. Type 2 diabetes mellitus with other specified complication, without long-term current use of insulin (HCC) Good blood sugar control is important to decrease the likelihood of diabetic complications such as nephropathy, neuropathy, limb loss, blindness, coronary artery disease, and death. Intensive lifestyle modification including diet,  exercise and weight loss are the first line of treatment for diabetes.  Continue Novolin and Category 1 meal plan.  5. Class 3 severe obesity with serious comorbidity and body mass index (BMI) of 45.0 to 49.9 in adult, unspecified obesity type [facility])  Gavin Lawson is currently in the action stage of change. As  such, his goal is to continue with weight loss efforts. He has agreed to the Category 1 Plan.   Exercise goals: No exercise has been prescribed at this time.  Behavioral modification strategies: increasing lean protein intake, decreasing simple carbohydrates, meal planning and cooking strategies and planning for success.  Handout: Additional Breakfast Options.  He was encouraged to eat 3 meals per day.  Gavin Lawson has agreed to follow-up with our clinic in 2 weeks. He was informed of the importance of frequent follow-up visits to maximize his success with intensive lifestyle modifications for his multiple health conditions.   Objective:   Blood pressure 120/65, pulse 66, temperature 98.1 F (36.7 C), height 5\' 8"  (1.727 m), weight 298 lb (135.2 kg), SpO2 99 %. Body mass index is 45.31 kg/m.  General: Cooperative, alert, well developed, in no acute distress. HEENT: Conjunctivae and lids unremarkable. Cardiovascular: Regular rhythm.  Lungs: Normal work of breathing. Neurologic: No focal deficits.   Lab Results  Component Value Date   CREATININE 1.34 (H) 08/17/2020   BUN 23 08/17/2020   NA 138 08/17/2020   K 6.4 (H) 08/17/2020   CL 103 08/17/2020   CO2 24 08/17/2020   Lab Results  Component Value Date   ALT 15 08/17/2020   AST 38 08/17/2020   ALKPHOS 65 08/17/2020   BILITOT 0.4 08/17/2020   Lab Results  Component Value Date   HGBA1C 8.7 (H) 08/17/2020   HGBA1C 9.5 (H) 09/28/2015   Lab Results  Component Value Date   INSULIN 18.0 08/17/2020   Lab Results  Component Value Date   TSH 4.230 08/17/2020   Lab Results  Component Value Date   CHOL 167 08/17/2020   HDL 40 08/17/2020   LDLCALC 89 08/17/2020   TRIG 227 (H) 08/17/2020   CHOLHDL 4.1 09/28/2015   Lab Results  Component Value Date   WBC 7.0 08/17/2020   HGB 11.9 (L) 08/17/2020   HCT 36.8 (L) 08/17/2020   MCV 93 08/17/2020   PLT 242 08/17/2020   Obesity Behavioral Intervention:   Approximately 15  minutes were spent on the discussion below.  ASK: We discussed the diagnosis of obesity with 10/17/2020 today and Jaamal agreed to give Gavin Lawson permission to discuss obesity behavioral modification therapy today.  ASSESS: Gavin Lawson has the diagnosis of obesity and his BMI today is 45.4. Gavin Lawson is in the action stage of change.   ADVISE: Gavin Lawson was educated on the multiple health risks of obesity as well as the benefit of weight loss to improve his health. He was advised of the need for long term treatment and the importance of lifestyle modifications to improve his current health and to decrease his risk of future health problems.  AGREE: Multiple dietary modification options and treatment options were discussed and Gavin Lawson agreed to follow the recommendations documented in the above note.  ARRANGE: Gavin Lawson was educated on the importance of frequent visits to treat obesity as outlined per CMS and USPSTF guidelines and agreed to schedule his next follow up appointment today.  Attestation Statements:   Reviewed by clinician on day of visit: allergies, medications, problem list, medical history, surgical history, family history, social history, and previous  encounter notes.  I, Insurance claims handler, CMA, am acting as Energy manager for William Hamburger, NP.  I have reviewed the above documentation for accuracy and completeness, and I agree with the above. -  Simra Fiebig d. Jazzalynn Rhudy, NP-C

## 2020-11-14 ENCOUNTER — Ambulatory Visit (INDEPENDENT_AMBULATORY_CARE_PROVIDER_SITE_OTHER): Payer: Medicare Other | Admitting: Adult Health

## 2020-11-14 ENCOUNTER — Other Ambulatory Visit: Payer: Self-pay

## 2020-11-14 ENCOUNTER — Encounter (INDEPENDENT_AMBULATORY_CARE_PROVIDER_SITE_OTHER): Payer: Self-pay | Admitting: Adult Health

## 2020-11-14 VITALS — BP 139/71 | HR 66 | Temp 97.8°F | Ht 68.0 in | Wt 289.0 lb

## 2020-11-14 DIAGNOSIS — I10 Essential (primary) hypertension: Secondary | ICD-10-CM | POA: Diagnosis not present

## 2020-11-14 DIAGNOSIS — E1169 Type 2 diabetes mellitus with other specified complication: Secondary | ICD-10-CM

## 2020-11-14 DIAGNOSIS — Z6841 Body Mass Index (BMI) 40.0 and over, adult: Secondary | ICD-10-CM | POA: Diagnosis not present

## 2020-11-14 DIAGNOSIS — I2510 Atherosclerotic heart disease of native coronary artery without angina pectoris: Secondary | ICD-10-CM

## 2020-11-15 NOTE — Progress Notes (Signed)
Chief Complaint:   OBESITY Gavin Lawson is here to discuss his progress with his obesity treatment plan along with follow-up of his obesity related diagnoses. Gavin Lawson is on the Category 1 Plan and states he is following his eating plan approximately 85% of the time. Gavin Lawson states he is exercising 0 minutes 0 times per week.  Today's visit was #: 6 Starting weight: 305 lbs Starting date: 08/17/2020 Today's weight: 289 lbs Today's date: 11/14/2020 Total lbs lost to date: 16 Total lbs lost since last in-office visit: 9  Interim History: Gavin Lawson was seen at the [facility] on 11/01/2020, at which time his oxycodone was increased to 10/325 mg BID and he will proceed with bilateral hip injections. Celebrex was discontinued due to decreased kidney function. He reports nausea without vomiting with increased dose of oxycodone.  Subjective:   Type 2 diabetes mellitus with other specified complication, without long-term current use of insulin (HCC). Gavin Lawson has had increased blood glucose levels since bilateral steroid hip injections on 11/08/2020. Fasting blood glucose readings are in the range of 160-180. He is on Lantus 12 units BID. He was previously intolerant to metformin - per patient he gained more than 40 lbs while on the Rx. GLP-1 therapy is too expensive.  Lab Results  Component Value Date   HGBA1C 8.7 (H) 08/17/2020   HGBA1C 9.5 (H) 09/28/2015   Lab Results  Component Value Date   LDLCALC 89 08/17/2020   CREATININE 1.34 (H) 08/17/2020   Lab Results  Component Value Date   INSULIN 18.0 08/17/2020   Essential hypertension. Blood pressure and heart rate are stable on today's office visit.   BP Readings from Last 3 Encounters:  11/14/20 139/71  10/31/20 120/65  10/18/20 129/76   Lab Results  Component Value Date   CREATININE 1.34 (H) 08/17/2020   CREATININE 1.04 03/25/2020   CREATININE 1.27 (H) 03/24/2020   Assessment/Plan:   Type 2 diabetes mellitus  with other specified complication, without long-term current use of insulin (HCC). Good blood sugar control is important to decrease the likelihood of diabetic complications such as nephropathy, neuropathy, limb loss, blindness, coronary artery disease, and death. Intensive lifestyle modification including diet, exercise and weight loss are the first line of treatment for diabetes. Gavin Lawson will continue Lantus as directed and continue to follow the Category 1 meal plan.  Essential hypertension. Gavin Lawson is working on healthy weight loss and exercise to improve blood pressure control. We will watch for signs of hypotension as he continues his lifestyle modifications. He will continue current antihypertensive therapy.  Class 3 severe obesity with body mass index (BMI) of 40.0 to 44.9 in adult, unspecified obesity type, unspecified whether serious comorbidity present (HCC).  Gavin Lawson is currently in the action stage of change. As such, his goal is to continue with weight loss efforts. He has agreed to the Category 1 Plan.   We recommend taking narcotic with a full meal to reduce GI upset. Gavin Lawson will follow-up with the Pain Clinic if GI side effects continue.  Handout was provided on Protein Shake Comparisons.  Exercise goals: No exercise has been prescribed at this time.  Behavioral modification strategies: increasing lean protein intake, no skipping meals, meal planning and cooking strategies and planning for success.  Gavin Lawson has agreed to follow-up with our clinic in 2 weeks. He was informed of the importance of frequent follow-up visits to maximize his success with intensive lifestyle modifications for his multiple health conditions.   Objective:  Blood pressure 139/71, pulse 66, temperature 97.8 F (36.6 C), height 5\' 8"  (1.727 m), weight 289 lb (131.1 kg), SpO2 97 %. Body mass index is 43.94 kg/m.  General: Cooperative, alert, well developed, in no acute distress. HEENT: Conjunctivae and  lids unremarkable. Cardiovascular: Regular rhythm.  Lungs: Normal work of breathing. Neurologic: No focal deficits.   Lab Results  Component Value Date   CREATININE 1.34 (H) 08/17/2020   BUN 23 08/17/2020   NA 138 08/17/2020   K 6.4 (H) 08/17/2020   CL 103 08/17/2020   CO2 24 08/17/2020   Lab Results  Component Value Date   ALT 15 08/17/2020   AST 38 08/17/2020   ALKPHOS 65 08/17/2020   BILITOT 0.4 08/17/2020   Lab Results  Component Value Date   HGBA1C 8.7 (H) 08/17/2020   HGBA1C 9.5 (H) 09/28/2015   Lab Results  Component Value Date   INSULIN 18.0 08/17/2020   Lab Results  Component Value Date   TSH 4.230 08/17/2020   Lab Results  Component Value Date   CHOL 167 08/17/2020   HDL 40 08/17/2020   LDLCALC 89 08/17/2020   TRIG 227 (H) 08/17/2020   CHOLHDL 4.1 09/28/2015   Lab Results  Component Value Date   WBC 7.0 08/17/2020   HGB 11.9 (L) 08/17/2020   HCT 36.8 (L) 08/17/2020   MCV 93 08/17/2020   PLT 242 08/17/2020   No results found for: IRON, TIBC, FERRITIN  Attestation Statements:   Reviewed by clinician on day of visit: allergies, medications, problem list, medical history, surgical history, family history, social history, and previous encounter notes.  Time spent on visit including pre-visit chart review and post-visit charting and care was 37 minutes.   I, 10/17/2020, am acting as Marianna Payment for Energy manager, NP-C   I have reviewed the above documentation for accuracy and completeness, and I agree with the above. -  Chealsea Paske d. Danfrod, NP-C

## 2020-11-29 ENCOUNTER — Ambulatory Visit (INDEPENDENT_AMBULATORY_CARE_PROVIDER_SITE_OTHER): Payer: Medicare Other | Admitting: Adult Health

## 2020-11-29 ENCOUNTER — Other Ambulatory Visit: Payer: Self-pay

## 2020-11-29 ENCOUNTER — Encounter (INDEPENDENT_AMBULATORY_CARE_PROVIDER_SITE_OTHER): Payer: Self-pay | Admitting: Adult Health

## 2020-11-29 VITALS — BP 129/59 | HR 60 | Temp 97.7°F | Ht 68.0 in | Wt 298.0 lb

## 2020-11-29 DIAGNOSIS — I2510 Atherosclerotic heart disease of native coronary artery without angina pectoris: Secondary | ICD-10-CM

## 2020-11-29 DIAGNOSIS — Z6841 Body Mass Index (BMI) 40.0 and over, adult: Secondary | ICD-10-CM

## 2020-11-29 DIAGNOSIS — K5909 Other constipation: Secondary | ICD-10-CM

## 2020-11-29 DIAGNOSIS — E559 Vitamin D deficiency, unspecified: Secondary | ICD-10-CM

## 2020-11-29 MED ORDER — VITAMIN D (ERGOCALCIFEROL) 1.25 MG (50000 UNIT) PO CAPS: 50000.0000 [IU] | ORAL_CAPSULE | ORAL | 0 refills | Status: DC

## 2020-12-04 DIAGNOSIS — K5909 Other constipation: Secondary | ICD-10-CM | POA: Insufficient documentation

## 2020-12-04 NOTE — Progress Notes (Signed)
Chief Complaint:   OBESITY Gavin Lawson is here to discuss his progress with his obesity treatment plan along with follow-up of his obesity related diagnoses. Gavin Lawson is on the Category 1 Plan and states he is following his eating plan approximately 80-85% of the time. Gavin Lawson states he is exercising 0 minutes 0 times per week.  Today's visit was #: 7 Starting weight: 305 lbs Starting date: 08/17/2020 Today's weight: 298 lbs Today's date: 11/29/2020 Total lbs lost to date: 7 Total lbs lost since last in-office visit: 0  Interim History: Gavin Lawson has been constipated the last 4 days. He is taking Percocet 10 mg 1/2 tab every 6 hours. He denies family history of colon cancer. He reports his last colonoscopy in 2019 was normal with recommendation to repeat in 5 years. He has been able to decrease his daily sugar/chocolate intake.  Subjective:   Vitamin D deficiency. Vitamin D level on 08/17/2020 was 10.7, well below goal of 50. Gavin Lawson is on Ergocalciferol. No nausea, vomiting, or muscle weakness.    Ref. Range 08/17/2020 13:24  Vitamin D, 25-Hydroxy Latest Ref Range: 30.0 - 100.0 ng/mL 10.7 (L)   Other constipation. Gavin Lawson estimates to have not had a bowel movement in 4 days. He denies abdominal pain or hematochezia. He denies family history of colon cancer. He reports last colonoscopy (2019) was normal with recommendation to repeat in 5 years.  Assessment/Plan:   Vitamin D deficiency. Low Vitamin D level contributes to fatigue and are associated with obesity, breast, and colon cancer. He was given a refill on his Vitamin D, Ergocalciferol, (DRISDOL) 1.25 MG (50000 UNIT) CAPS capsule every week #4 with 0 refills and will follow-up for routine testing of Vitamin D, at least 2-3 times per year to avoid over-replacement.   Other constipation. Gavin Lawson was informed that a decrease in bowel movement frequency is normal while losing weight, but stools should not be hard or painful. Orders and  follow up as documented in patient record. He will increase his water and fiber intake and use OTC Miralax as directed.   Counseling Getting to Good Bowel Health: Your goal is to have one soft bowel movement each day. Drink at least 8 glasses of water each day. Eat plenty of fiber (goal is over 25 grams each day). It is best to get most of your fiber from dietary sources which includes leafy green vegetables, fresh fruit, and whole grains. You may need to add fiber with the help of OTC fiber supplements. These include Metamucil, Citrucel, and Flaxseed. If you are still having trouble, try adding Miralax or Magnesium Citrate. If all of these changes do not work, Dietitian.  Class 3 severe obesity with serious comorbidity and body mass index (BMI) of 45.0 to 49.9 in adult, unspecified obesity type (HCC).  Gavin Lawson is currently in the action stage of change. As such, his goal is to continue with weight loss efforts. He has agreed to the Category 1 Plan.   Handouts were provided on Holiday Strategies and Holiday Recipes.  Exercise goals: No exercise has been prescribed at this time.  Behavioral modification strategies: increasing lean protein intake, decreasing simple carbohydrates, no skipping meals, meal planning and cooking strategies and planning for success.  Gavin Lawson has agreed to follow-up with our clinic in 3 weeks. He was informed of the importance of frequent follow-up visits to maximize his success with intensive lifestyle modifications for his multiple health conditions.   Objective:   Blood pressure (!) 129/59,  pulse 60, temperature 97.7 F (36.5 C), height 5\' 8"  (1.727 m), weight 298 lb (135.2 kg), SpO2 96 %. Body mass index is 45.31 kg/m.  General: Cooperative, alert, well developed, in no acute distress. HEENT: Conjunctivae and lids unremarkable. Cardiovascular: Regular rhythm.  Lungs: Normal work of breathing. Neurologic: No focal deficits.   Lab Results   Component Value Date   CREATININE 1.34 (H) 08/17/2020   BUN 23 08/17/2020   NA 138 08/17/2020   K 6.4 (H) 08/17/2020   CL 103 08/17/2020   CO2 24 08/17/2020   Lab Results  Component Value Date   ALT 15 08/17/2020   AST 38 08/17/2020   ALKPHOS 65 08/17/2020   BILITOT 0.4 08/17/2020   Lab Results  Component Value Date   HGBA1C 8.7 (H) 08/17/2020   HGBA1C 9.5 (H) 09/28/2015   Lab Results  Component Value Date   INSULIN 18.0 08/17/2020   Lab Results  Component Value Date   TSH 4.230 08/17/2020   Lab Results  Component Value Date   CHOL 167 08/17/2020   HDL 40 08/17/2020   LDLCALC 89 08/17/2020   TRIG 227 (H) 08/17/2020   CHOLHDL 4.1 09/28/2015   Lab Results  Component Value Date   WBC 7.0 08/17/2020   HGB 11.9 (L) 08/17/2020   HCT 36.8 (L) 08/17/2020   MCV 93 08/17/2020   PLT 242 08/17/2020   No results found for: IRON, TIBC, FERRITIN  Attestation Statements:   Reviewed by clinician on day of visit: allergies, medications, problem list, medical history, surgical history, family history, social history, and previous encounter notes.  I, 10/17/2020, am acting as [place] Payment for Energy manager, NP-C   I have reviewed the above documentation for accuracy and completeness, and I agree with the above. -  Titiana Severa d. Stephenson Cichy, NP-C

## 2020-12-21 ENCOUNTER — Ambulatory Visit (INDEPENDENT_AMBULATORY_CARE_PROVIDER_SITE_OTHER): Payer: Medicare Other | Admitting: Adult Health

## 2020-12-27 ENCOUNTER — Encounter (INDEPENDENT_AMBULATORY_CARE_PROVIDER_SITE_OTHER): Payer: Self-pay

## 2021-01-01 ENCOUNTER — Telehealth (INDEPENDENT_AMBULATORY_CARE_PROVIDER_SITE_OTHER): Payer: Medicare Other | Admitting: Adult Health

## 2021-01-01 ENCOUNTER — Encounter (INDEPENDENT_AMBULATORY_CARE_PROVIDER_SITE_OTHER): Payer: Self-pay

## 2021-01-01 ENCOUNTER — Other Ambulatory Visit: Payer: Self-pay

## 2021-01-01 ENCOUNTER — Encounter (INDEPENDENT_AMBULATORY_CARE_PROVIDER_SITE_OTHER): Payer: Self-pay | Admitting: Adult Health

## 2021-01-01 DIAGNOSIS — Z794 Long term (current) use of insulin: Secondary | ICD-10-CM

## 2021-01-01 DIAGNOSIS — E559 Vitamin D deficiency, unspecified: Secondary | ICD-10-CM | POA: Diagnosis not present

## 2021-01-01 DIAGNOSIS — E118 Type 2 diabetes mellitus with unspecified complications: Secondary | ICD-10-CM | POA: Diagnosis not present

## 2021-01-01 DIAGNOSIS — Z6841 Body Mass Index (BMI) 40.0 and over, adult: Secondary | ICD-10-CM

## 2021-01-01 MED ORDER — VITAMIN D (ERGOCALCIFEROL) 1.25 MG (50000 UNIT) PO CAPS: 50000.0000 [IU] | ORAL_CAPSULE | ORAL | 0 refills | Status: DC

## 2021-01-03 NOTE — Progress Notes (Signed)
TeleHealth Visit:  Due to the COVID-19 pandemic, this visit was completed with telemedicine (audio/video) technology to reduce patient and provider exposure as well as to preserve personal protective equipment.   Gavin Lawson has verbally consented to this TeleHealth visit. The patient is located at home, the provider is located at the [facility] office. The participants in this visit include the listed provider and patient. The visit was conducted today via video.  Chief Complaint: OBESITY Gavin Lawson is here to discuss his progress with his obesity treatment plan along with follow-up of his obesity related diagnoses. Gavin Lawson is on the Category 1 Plan and states he is following his eating plan approximately 70% of the time. Gavin Lawson states he is exercising 0 minutes 0 times per week.  Today's visit was #: 8 Starting weight: 305 lbs Starting date: 08/17/2020  Interim History: Gavin Lawson reports only chang in overall health is increased pain (chronic lumbar back pain, bilateral hip pain, R sided sciatica, and new onset cervical neck pain). He was seen by pain management 12/29/2019-Lyrica 50 mg BID was added. Celebrex was previously discontinued due to medication ineffectiveness and change kidney function. Last CMP 08/17/2020 with GFR result 54.  Subjective:   1. Type 2 diabetes mellitus with complication, with long-term current use of insulin (HCC) Gavin Lawson is on Novalin 12 units BID. He reports significant weight gain with Metformin. Fasting ambulatory blood glucose 200's. He denies episodes of hypoglycemia.  2. Vitamin D deficiency Gavin Lawson's Vitamin D level was 10.7 on 08/17/2020. He is currently taking prescription vitamin D 50,000 IU each week. He denies nausea, vomiting or muscle weakness.  Ref. Range 08/17/2020 13:24  Vitamin D, 25-Hydroxy Latest Ref Range: 30.0 - 100.0 ng/mL 10.7 (L)   Assessment/Plan:   1. Type 2 diabetes mellitus with complication, with long-term current use of insulin  (HCC) Good blood sugar control is important to decrease the likelihood of diabetic complications such as nephropathy, neuropathy, limb loss, blindness, coronary artery disease, and death. Intensive lifestyle modification including diet, exercise and weight loss are the first line of treatment for diabetes. Continue to steadily increase protein. Limit simple carbohydrates.  2. Vitamin D deficiency Low Vitamin D level contributes to fatigue and are associated with obesity, breast, and colon cancer. He agrees to continue to take prescription Vitamin D @50 ,000 IU every week and will follow-up for routine testing of Vitamin D, at least 2-3 times per year to avoid over-replacement. Refill Vit D for 1 month, as per below..  - Vitamin D, Ergocalciferol, (DRISDOL) 1.25 MG (50000 UNIT) CAPS capsule; Take 1 capsule (50,000 Units total) by mouth every 7 (seven) days.  Dispense: 4 capsule; Refill: 0  3. Class 3 severe obesity with serious comorbidity and body mass index (BMI) of 45.0 to 49.9 in adult, unspecified obesity type Gavin Lawson) Gavin Lawson is currently in the action stage of change. As such, his goal is to continue with weight loss efforts. He has agreed to the Category 1 Plan.   Exercise goals: Patient is unable to exercise due to chronic pain levels.  Behavioral modification strategies: increasing lean protein intake, decreasing eating out, no skipping meals, meal planning and cooking strategies, better snacking choices and planning for success.  Gavin Lawson has agreed to follow-up with our clinic in 2 weeks. He was informed of the importance of frequent follow-up visits to maximize his success with intensive lifestyle modifications for his multiple health conditions.  Objective:   VITALS: Per patient if applicable, see vitals. GENERAL: Alert and in no  acute distress. CARDIOPULMONARY: No increased WOB. Speaking in clear sentences.  PSYCH: Pleasant and cooperative. Speech normal rate and rhythm. Affect is  appropriate. Insight and judgement are appropriate. Attention is focused, linear, and appropriate.  NEURO: Oriented as arrived to appointment on time with no prompting.   Lab Results  Component Value Date   CREATININE 1.34 (H) 08/17/2020   BUN 23 08/17/2020   NA 138 08/17/2020   K 6.4 (H) 08/17/2020   CL 103 08/17/2020   CO2 24 08/17/2020   Lab Results  Component Value Date   ALT 15 08/17/2020   AST 38 08/17/2020   ALKPHOS 65 08/17/2020   BILITOT 0.4 08/17/2020   Lab Results  Component Value Date   HGBA1C 8.7 (H) 08/17/2020   HGBA1C 9.5 (H) 09/28/2015   Lab Results  Component Value Date   INSULIN 18.0 08/17/2020   Lab Results  Component Value Date   TSH 4.230 08/17/2020   Lab Results  Component Value Date   CHOL 167 08/17/2020   HDL 40 08/17/2020   LDLCALC 89 08/17/2020   TRIG 227 (H) 08/17/2020   CHOLHDL 4.1 09/28/2015   Lab Results  Component Value Date   WBC 7.0 08/17/2020   HGB 11.9 (L) 08/17/2020   HCT 36.8 (L) 08/17/2020   MCV 93 08/17/2020   PLT 242 08/17/2020   No results found for: IRON, TIBC, FERRITIN  Attestation Statements:   Reviewed by clinician on day of visit: allergies, medications, problem list, medical history, surgical history, family history, social history, and previous encounter notes.  Time spent on visit including pre-visit chart review and post-visit charting and care was 32 minutes.   Edmund Hilda, am acting as Energy manager for Gavin Hamburger, NP.  I have reviewed the above documentation for accuracy and completeness, and I agree with the above. - Gavin Lawson d. Gavin Heidel, NP-C

## 2021-01-15 ENCOUNTER — Encounter (INDEPENDENT_AMBULATORY_CARE_PROVIDER_SITE_OTHER): Payer: Self-pay | Admitting: Adult Health

## 2021-01-15 ENCOUNTER — Ambulatory Visit (INDEPENDENT_AMBULATORY_CARE_PROVIDER_SITE_OTHER): Payer: Medicare Other | Admitting: Adult Health

## 2021-01-15 ENCOUNTER — Other Ambulatory Visit: Payer: Self-pay

## 2021-01-15 VITALS — BP 114/62 | HR 59 | Temp 98.0°F | Ht 68.0 in | Wt 297.0 lb

## 2021-01-15 DIAGNOSIS — E559 Vitamin D deficiency, unspecified: Secondary | ICD-10-CM | POA: Diagnosis not present

## 2021-01-15 DIAGNOSIS — E118 Type 2 diabetes mellitus with unspecified complications: Secondary | ICD-10-CM | POA: Diagnosis not present

## 2021-01-15 DIAGNOSIS — Z794 Long term (current) use of insulin: Secondary | ICD-10-CM

## 2021-01-15 DIAGNOSIS — E1169 Type 2 diabetes mellitus with other specified complication: Secondary | ICD-10-CM

## 2021-01-15 DIAGNOSIS — E1159 Type 2 diabetes mellitus with other circulatory complications: Secondary | ICD-10-CM | POA: Diagnosis not present

## 2021-01-15 DIAGNOSIS — Z6841 Body Mass Index (BMI) 40.0 and over, adult: Secondary | ICD-10-CM | POA: Diagnosis not present

## 2021-01-15 DIAGNOSIS — G8929 Other chronic pain: Secondary | ICD-10-CM | POA: Diagnosis not present

## 2021-01-15 DIAGNOSIS — I152 Hypertension secondary to endocrine disorders: Secondary | ICD-10-CM

## 2021-01-15 MED ORDER — VITAMIN D (ERGOCALCIFEROL) 1.25 MG (50000 UNIT) PO CAPS: 50000.0000 [IU] | ORAL_CAPSULE | ORAL | 0 refills | Status: DC

## 2021-01-16 LAB — VITAMIN D 25 HYDROXY (VIT D DEFICIENCY, FRACTURES): Vit D, 25-Hydroxy: 25.8 ng/mL — ABNORMAL LOW (ref 30.0–100.0)

## 2021-01-16 LAB — COMPREHENSIVE METABOLIC PANEL
ALT: 10 IU/L (ref 0–44)
AST: 14 IU/L (ref 0–40)
Albumin/Globulin Ratio: 1.7 (ref 1.2–2.2)
Albumin: 3.8 g/dL (ref 3.8–4.8)
Alkaline Phosphatase: 81 IU/L (ref 44–121)
BUN/Creatinine Ratio: 19 (ref 10–24)
BUN: 34 mg/dL — ABNORMAL HIGH (ref 8–27)
Bilirubin Total: 0.5 mg/dL (ref 0.0–1.2)
CO2: 27 mmol/L (ref 20–29)
Calcium: 8.8 mg/dL (ref 8.6–10.2)
Chloride: 96 mmol/L (ref 96–106)
Creatinine, Ser: 1.82 mg/dL — ABNORMAL HIGH (ref 0.76–1.27)
GFR calc Af Amer: 43 mL/min/{1.73_m2} — ABNORMAL LOW (ref >59)
GFR calc non Af Amer: 37 mL/min/{1.73_m2} — ABNORMAL LOW (ref >59)
Globulin, Total: 2.3 g/dL (ref 1.5–4.5)
Glucose: 239 mg/dL — ABNORMAL HIGH (ref 65–99)
Potassium: 5.2 mmol/L (ref 3.5–5.2)
Sodium: 136 mmol/L (ref 134–144)
Total Protein: 6.1 g/dL (ref 6.0–8.5)

## 2021-01-16 LAB — HEMOGLOBIN A1C
Est. average glucose Bld gHb Est-mCnc: 229 mg/dL
Hgb A1c MFr Bld: 9.6 % — ABNORMAL HIGH (ref 4.8–5.6)

## 2021-01-16 LAB — INSULIN, RANDOM: INSULIN: 20.9 u[IU]/mL (ref 2.6–24.9)

## 2021-01-16 NOTE — Progress Notes (Signed)
Chief Complaint:   OBESITY Gavin Lawson is here to discuss his progress with his obesity treatment plan along with follow-up of his obesity related diagnoses. Gavin Lawson is on the Category 1 Plan and states he is following his eating plan approximately 50% of the time. Gavin Lawson states he is exercising 0 minutes 0 times per week.  Today's visit was #: 9 Starting weight: 305 lbs Starting date: 08/17/2020 Today's weight: 297 lbs Today's date: 01/15/2021 Total lbs lost to date: 8 lbs Total lbs lost since last in-office visit: 1 lb  Interim History: Gavin Lawson insurance changed to [employer]. GLP-1 may be covered. PCP has called in prescription for Rybelsus, as Ozempic is too costly. He was unable to ambulate much the last 2 weeks due to increased pain. He also reports decreased appetite with pain and estimates to have followed Category 1 plan approximately 50% of the time.  Subjective:   1. Vitamin D deficiency Gavin Lawson Vitamin D level was 10.7 on 08/17/2020. He is currently taking prescription vitamin D 50,000 IU each week. He denies nausea, vomiting or muscle weakness.   Ref. Range 08/17/2020 13:24  Vitamin D, 25-Hydroxy Latest Ref Range: 30.0 - 100.0 ng/mL 10.7 (L)   2. Type 2 diabetes mellitus with complication, with long-term current use of insulin (HCC) A1c on 08/17/2020 was 8.7, which is above goal. Pt's blood glucose and insulin are both above goal. His ambulatory fasting BG 225-250. He is currently on Novolin 12 units BID. PCP ordered Rybelsus (Ozempic is too expensive). Pt has yet to start oral GLP-1. He reports sig weight gain with Metformin in past.  Lab Results  Component Value Date   HGBA1C 9.6 (H) 01/15/2021   HGBA1C 8.7 (H) 08/17/2020   HGBA1C 9.5 (H) 09/28/2015   Lab Results  Component Value Date   LDLCALC 89 08/17/2020   CREATININE 1.82 (H) 01/15/2021   Lab Results  Component Value Date   INSULIN WILL FOLLOW 01/15/2021   INSULIN 18.0 08/17/2020    3. Hypertension associated  with diabetes (HCC) BP and heart rate are at goal. He is currently taking Coreg 25 mg BID, Imdur 30 mg daily, and hydralazine.  Pt experienced hyperkalemia with spironolactone. It resolved after discontinuing spironolactone and increased Torsemide.  Amlodipine previously caused lower extremity edema.   BP Readings from Last 3 Encounters:  01/15/21 114/62  11/29/20 (!) 129/59  11/14/20 139/71    4. Other chronic pain Pt is on Oxycodone 10-325 mg 1/2 tab every 6 hours. PDMD verified- Oxycodone filled 01/07/2021, Lyrica filled 12/28/2020. Pt denies over sedation or increased constipation. Despite current pain regimen, pt continues to experience increased pain levels.  Assessment/Plan:   1. Vitamin D deficiency Low Vitamin D level contributes to fatigue and are associated with obesity, breast, and colon cancer. He agrees to continue to take prescription Vitamin D @50 ,000 IU every week and will follow-up for routine testing of Vitamin D, at least 2-3 times per year to avoid over-replacement. Check labs today and refill Vit D, as per below.  - Vitamin D, Ergocalciferol, (DRISDOL) 1.25 MG (50000 UNIT) CAPS capsule; Take 1 capsule (50,000 Units total) by mouth every 7 (seven) days.  Dispense: 4 capsule; Refill: 0 - VITAMIN D 25 Hydroxy (Vit-D Deficiency, Fractures)  2. Type 2 diabetes mellitus with complication, with long-term current use of insulin (HCC) Good blood sugar control is important to decrease the likelihood of diabetic complications such as nephropathy, neuropathy, limb loss, blindness, coronary artery disease, and death. Intensive lifestyle modification including  diet, exercise and weight loss are the first line of treatment for diabetes. Check labs today. Follow anti-diabetic regimen per PCP.  - Hemoglobin A1c - Insulin, random  3. Hypertension associated with diabetes (HCC) Gavin Lawson is working on healthy weight loss and exercise to improve blood pressure control. We will watch for  signs of hypotension as he continues his lifestyle modifications. Check labs today. Continue anti-hypertensive regimen. - Comprehensive metabolic panel  4. Other chronic pain Continue f/u with pain management as directed. Remain as active as tolerated.  5. Class 3 severe obesity with serious comorbidity and body mass index (BMI) of 45.0 to 49.9 in adult, unspecified obesity type [facility]) Gavin Lawson is currently in the action stage of change. As such, his goal is to continue with weight loss efforts. He has agreed to the Category 1 Plan.   Handout: Category 1 Grocery List  Exercise goals: No exercise has been prescribed at this time.  Behavioral modification strategies: increasing lean protein intake, no skipping meals, meal planning and cooking strategies, better snacking choices and planning for success.  Gavin Lawson has agreed to follow-up with our clinic in 2 weeks. He was informed of the importance of frequent follow-up visits to maximize his success with intensive lifestyle modifications for his multiple health conditions.   Gavin Lawson was informed we would discuss his lab results at his next visit unless there is a critical issue that needs to be addressed sooner. Gavin Lawson agreed to keep his next visit at the agreed upon time to discuss these results.  Objective:   Blood pressure 114/62, pulse (!) 59, temperature 98 F (36.7 C), height 5\' 8"  (1.727 m), weight 297 lb (134.7 kg), SpO2 96 %. Body mass index is 45.16 kg/m.  General: Cooperative, alert, well developed, in no acute distress. HEENT: Conjunctivae and lids unremarkable. Cardiovascular: Regular rhythm.  Lungs: Normal work of breathing. Neurologic: No focal deficits.   Lab Results  Component Value Date   CREATININE 1.82 (H) 01/15/2021   BUN 34 (H) 01/15/2021   NA 136 01/15/2021   K 5.2 01/15/2021   CL 96 01/15/2021   CO2 27 01/15/2021   Lab Results  Component Value Date   ALT 10 01/15/2021   AST 14 01/15/2021   ALKPHOS 81  01/15/2021   BILITOT 0.5 01/15/2021   Lab Results  Component Value Date   HGBA1C 9.6 (H) 01/15/2021   HGBA1C 8.7 (H) 08/17/2020   HGBA1C 9.5 (H) 09/28/2015   Lab Results  Component Value Date   INSULIN WILL FOLLOW 01/15/2021   INSULIN 18.0 08/17/2020   Lab Results  Component Value Date   TSH 4.230 08/17/2020   Lab Results  Component Value Date   CHOL 167 08/17/2020   HDL 40 08/17/2020   LDLCALC 89 08/17/2020   TRIG 227 (H) 08/17/2020   CHOLHDL 4.1 09/28/2015   Lab Results  Component Value Date   WBC 7.0 08/17/2020   HGB 11.9 (L) 08/17/2020   HCT 36.8 (L) 08/17/2020   MCV 93 08/17/2020   PLT 242 08/17/2020   No results found for: IRON, TIBC, FERRITIN  Obesity Behavioral Intervention:   Approximately 15 minutes were spent on the discussion below.  ASK: We discussed the diagnosis of obesity with 10/17/2020 today and [place] agreed to give Gavin Lawson permission to discuss obesity behavioral modification therapy today.  ASSESS: Gavin Lawson has the diagnosis of obesity and his BMI today is 45.2. Gavin Lawson is in the action stage of change.   ADVISE: Gavin Lawson was educated on the multiple health  risks of obesity as well as the benefit of weight loss to improve his health. He was advised of the need for long term treatment and the importance of lifestyle modifications to improve his current health and to decrease his risk of future health problems.  AGREE: Multiple dietary modification options and treatment options were discussed and Gavin Lawson agreed to follow the recommendations documented in the above note.  ARRANGE: Gavin Lawson was educated on the importance of frequent visits to treat obesity as outlined per CMS and USPSTF guidelines and agreed to schedule his next follow up appointment today.  Attestation Statements:   Reviewed by clinician on day of visit: allergies, medications, problem list, medical history, surgical history, family history, social history, and previous encounter notes.  Gavin Lawson Gavin Lawson, am acting as Energy manager for William Hamburger, NP.  I have reviewed the above documentation for accuracy and completeness, and I agree with the above. -  Gavin Lawson Fina d. Moo Gravley, NP-C

## 2021-01-29 ENCOUNTER — Other Ambulatory Visit: Payer: Self-pay

## 2021-01-29 ENCOUNTER — Ambulatory Visit (INDEPENDENT_AMBULATORY_CARE_PROVIDER_SITE_OTHER): Payer: Medicare Other | Admitting: Adult Health

## 2021-01-29 ENCOUNTER — Encounter (INDEPENDENT_AMBULATORY_CARE_PROVIDER_SITE_OTHER): Payer: Self-pay | Admitting: Adult Health

## 2021-01-29 VITALS — BP 156/66 | HR 54 | Temp 97.3°F | Ht 68.0 in | Wt 302.0 lb

## 2021-01-29 DIAGNOSIS — E875 Hyperkalemia: Secondary | ICD-10-CM | POA: Diagnosis not present

## 2021-01-29 DIAGNOSIS — E559 Vitamin D deficiency, unspecified: Secondary | ICD-10-CM

## 2021-01-29 DIAGNOSIS — N1832 Chronic kidney disease, stage 3b: Secondary | ICD-10-CM

## 2021-01-29 DIAGNOSIS — E118 Type 2 diabetes mellitus with unspecified complications: Secondary | ICD-10-CM

## 2021-01-29 DIAGNOSIS — Z6841 Body Mass Index (BMI) 40.0 and over, adult: Secondary | ICD-10-CM

## 2021-01-29 DIAGNOSIS — Z794 Long term (current) use of insulin: Secondary | ICD-10-CM

## 2021-01-30 DIAGNOSIS — N1832 Chronic kidney disease, stage 3b: Secondary | ICD-10-CM | POA: Insufficient documentation

## 2021-01-30 NOTE — Progress Notes (Signed)
Chief Complaint:   OBESITY Gavin Lawson is here to discuss his progress with his obesity treatment plan along with follow-up of his obesity related diagnoses. Gavin Lawson is on the Category 1 Plan and states he is following his eating plan approximately 45% of the time. Gavin Lawson states he is doing 0 minutes 0 times per week.  Today's visit was #: 10 Starting weight: 305 lbs Starting date: 08/17/2020 Today's weight: 302 lbs Today's date: 01/29/2021 Total lbs lost to date: 3 lbs Total lbs lost since last in-office visit: 0  Interim History: Gavin Lawson is followed by pain management every 3 months at [facility]. He feels that the pain has elevated his BG and making it a challenge to eat on Category 1 meal plan.  Subjective:   1. Type 2 diabetes mellitus with complication, with long-term current use of insulin (HCC) Worsening. Discussed labs with patient today.  01/15/2021 A1c 9.6 and insulin level 20.9.  Pt is on Novolin 12 units BID. PCP has tried Trulicity,Rybelsus, Victoza- all were cost prohibitive.   Pt reports Metformin intolerance- significant weight gain.   Lab Results  Component Value Date   HGBA1C 9.6 (H) 01/15/2021   HGBA1C 8.7 (H) 08/17/2020   HGBA1C 9.5 (H) 09/28/2015   Lab Results  Component Value Date   LDLCALC 89 08/17/2020   CREATININE 1.82 (H) 01/15/2021   Lab Results  Component Value Date   INSULIN 20.9 01/15/2021   INSULIN 18.0 08/17/2020    2. Stage 3b chronic kidney disease (HCC) Worsening. Discussed labs with patient today. 01/15/2021 CMP resulted an elevated creatinine of 1.82 and decreased GFR of 37. He had previous GFR 08/17/2020 of 54.  Lab Results  Component Value Date   CREATININE 1.82 (H) 01/15/2021   CREATININE 1.34 (H) 08/17/2020   CREATININE 1.04 03/25/2020   Lab Results  Component Value Date   CREATININE 1.82 (H) 01/15/2021   BUN 34 (H) 01/15/2021   NA 136 01/15/2021   K 5.2 01/15/2021   CL 96 01/15/2021   CO2 27  01/15/2021    3. Vitamin D deficiency Gavin Lawson's Vitamin D level was 25.8 on 01/15/2021. He is currently taking prescription vitamin D 50,000 IU each week. He denies nausea, vomiting or muscle weakness. Level is below goal of 50.   Ref. Range 01/15/2021 14:40  Vitamin D, 25-Hydroxy Latest Ref Range: 30.0 - 100.0 ng/mL 25.8 (L)   4. Hyperkalemia 01/15/2021 CMP resulted a normalized potassium level of 5.2, elevated at last check of 6.4 on 08/17/2020. Pt has a history of elevated potassium in Epic records.  Assessment/Plan:   1. Type 2 diabetes mellitus with complication, with long-term current use of insulin (HCC) Good blood sugar control is important to decrease the likelihood of diabetic complications such as nephropathy, neuropathy, limb loss, blindness, coronary artery disease, and death. Intensive lifestyle modification including diet, exercise and weight loss are the first line of treatment for diabetes. Follow up with PCP ASAP- Labs were faxed and in Epic for review. Decrease simple carb intake.  2. Stage 3b chronic kidney disease (HCC) Lab results and trends reviewed. We discussed several lifestyle modifications today and he will continue to work on diet, exercise and weight loss efforts. Avoid nephrotoxic medications. Orders and follow up as documented in patient record. Avoid NSAIDs. Follow up with PCP. Labs faxed and in Epic for review.  Counseling . Chronic kidney disease (CKD) happens when the kidneys are damaged over a long period of time. . Most of  the time, this condition does not go away, but it can usually be controlled. Steps must be taken to slow down the kidney damage or to stop it from getting worse. . Intensive lifestyle modifications are the first line treatment for this issue.  Marland Kitchen Avoid buying foods that are: processed, frozen, or prepackaged to avoid excess salt.  3. Vitamin D deficiency Low Vitamin D level contributes to fatigue and are associated with obesity, breast, and  colon cancer. He agrees to continue to take prescription Vitamin D @50 ,000 IU every week and will follow-up for routine testing of Vitamin D, at least 2-3 times per year to avoid over-replacement. No refill needed today.  4. Hyperkalemia Monitor labs. Avoid potassium supplement. Avoid potassium rich foods like potatoes and bananas.  5. Class 3 severe obesity with serious comorbidity and body mass index (BMI) of 45.0 to 49.9 in adult, unspecified obesity type [facility]) Gavin Lawson is currently in the action stage of change. As such, his goal is to continue with weight loss efforts. He has agreed to the Category 1 Plan.   Assisted pt with re-setting MyChart account.  Exercise goals: No exercise has been prescribed at this time.  Behavioral modification strategies: increasing lean protein intake, decreasing simple carbohydrates, no skipping meals, meal planning and cooking strategies, keeping healthy foods in the home, better snacking choices and planning for success.  Gavin Lawson has agreed to follow-up with our clinic in 2 weeks. He was informed of the importance of frequent follow-up visits to maximize his success with intensive lifestyle modifications for his multiple health conditions.   Objective:   Blood pressure (!) 156/66, pulse (!) 54, temperature (!) 97.3 F (36.3 C), height 5\' 8"  (1.727 m), weight (!) 302 lb (137 kg), SpO2 97 %. Body mass index is 45.92 kg/m.  General: Cooperative, alert, well developed, in no acute distress. HEENT: Conjunctivae and lids unremarkable. Cardiovascular: Regular rhythm.  Lungs: Normal work of breathing. Neurologic: No focal deficits.   Lab Results  Component Value Date   CREATININE 1.82 (H) 01/15/2021   BUN 34 (H) 01/15/2021   NA 136 01/15/2021   K 5.2 01/15/2021   CL 96 01/15/2021   CO2 27 01/15/2021   Lab Results  Component Value Date   ALT 10 01/15/2021   AST 14 01/15/2021   ALKPHOS 81 01/15/2021   BILITOT 0.5 01/15/2021   Lab Results   Component Value Date   HGBA1C 9.6 (H) 01/15/2021   HGBA1C 8.7 (H) 08/17/2020   HGBA1C 9.5 (H) 09/28/2015   Lab Results  Component Value Date   INSULIN 20.9 01/15/2021   INSULIN 18.0 08/17/2020   Lab Results  Component Value Date   TSH 4.230 08/17/2020   Lab Results  Component Value Date   CHOL 167 08/17/2020   HDL 40 08/17/2020   LDLCALC 89 08/17/2020   TRIG 227 (H) 08/17/2020   CHOLHDL 4.1 09/28/2015   Lab Results  Component Value Date   WBC 7.0 08/17/2020   HGB 11.9 (L) 08/17/2020   HCT 36.8 (L) 08/17/2020   MCV 93 08/17/2020   PLT 242 08/17/2020    Attestation Statements:   Reviewed by clinician on day of visit: allergies, medications, problem list, medical history, surgical history, family history, social history, and previous encounter notes.  Time spent on visit including pre-visit chart review and post-visit care and charting was 38 minutes.   10/17/2020, am acting as 10/17/2020 for Edmund Hilda, NP.  I have reviewed the above documentation for accuracy and  completeness, and I agree with the above. -  Daman Steffenhagen d. Cesare Sumlin, NP-C

## 2021-02-14 ENCOUNTER — Ambulatory Visit (INDEPENDENT_AMBULATORY_CARE_PROVIDER_SITE_OTHER): Payer: Medicare Other | Admitting: Adult Health

## 2021-02-14 ENCOUNTER — Other Ambulatory Visit: Payer: Self-pay

## 2021-02-14 ENCOUNTER — Encounter (INDEPENDENT_AMBULATORY_CARE_PROVIDER_SITE_OTHER): Payer: Self-pay | Admitting: Adult Health

## 2021-02-14 VITALS — BP 138/63 | HR 65 | Temp 97.5°F | Ht 68.0 in | Wt 304.0 lb

## 2021-02-14 DIAGNOSIS — Z6841 Body Mass Index (BMI) 40.0 and over, adult: Secondary | ICD-10-CM

## 2021-02-14 DIAGNOSIS — N1832 Chronic kidney disease, stage 3b: Secondary | ICD-10-CM

## 2021-02-14 DIAGNOSIS — E559 Vitamin D deficiency, unspecified: Secondary | ICD-10-CM

## 2021-02-14 DIAGNOSIS — E1169 Type 2 diabetes mellitus with other specified complication: Secondary | ICD-10-CM | POA: Diagnosis not present

## 2021-02-14 MED ORDER — BD PEN NEEDLE NANO 2ND GEN 32G X 4 MM MISC: 1.0000 | Freq: Two times a day (BID) | 0 refills | Status: AC

## 2021-02-14 MED ORDER — VITAMIN D (ERGOCALCIFEROL) 1.25 MG (50000 UNIT) PO CAPS: 50000.0000 [IU] | ORAL_CAPSULE | ORAL | 0 refills | Status: AC

## 2021-02-15 NOTE — Progress Notes (Signed)
Chief Complaint:   OBESITY Gavin Lawson is here to discuss his progress with his obesity treatment plan along with follow-up of his obesity related diagnoses. Gavin Lawson is on the Category 1 Plan and states he is following his eating plan approximately 20% of the time. Gavin Lawson states he is dong 0 minutes 0 times per week.  Today's visit was #: 11 Starting weight: 305 lbs Starting date: 08/17/2020 Today's weight: 304 lbs Today's date: 02/14/2021 Total lbs lost to date: 1 lb Total lbs lost since last in-office visit: 0  Interim History: Gavin Lawson's PCP referred him to nephrology to address CKD, per Epic notes. His PCP has called him 3 times and was unable to reach pt and his voicemail is full. He has been unable to check ambulatory blood glucose as he is out of Contour test strips.  Subjective:   1. Vitamin D deficiency Gavin Lawson's Vitamin D level was 25.8 (below goal of 50) on 01/15/2021. He is currently taking prescription vitamin D 50,000 IU each week. He denies nausea, vomiting or muscle weakness.   Ref. Range 01/15/2021 14:40  Vitamin D, 25-Hydroxy Latest Ref Range: 30.0 - 100.0 ng/mL 25.8 (L)   2. Type 2 diabetes mellitus with other specified complication, without long-term current use of insulin (HCC) 02/08/2021 PCP has attempted to contact him multiple times to discuss uncontrolled diabetes.  Pt's VM is full- unable to leave message. Relied this information to pt today. Pt. Is out of Contour test strips, has been unable to monitor ambulatory BG.  He denies sx's of hypoglycemia.  Lab Results  Component Value Date   HGBA1C 9.6 (H) 01/15/2021   HGBA1C 8.7 (H) 08/17/2020   HGBA1C 9.5 (H) 09/28/2015   Lab Results  Component Value Date   LDLCALC 89 08/17/2020   CREATININE 1.82 (H) 01/15/2021   Lab Results  Component Value Date   INSULIN 20.9 01/15/2021   INSULIN 18.0 08/17/2020    3. Stage 3b chronic kidney disease (HCC) Gavin Lawson was recently referred to nephrology by his PCP.  He has not  received call from Nephrology to schedule an initial OV- likely due to his full VM.  Assessment/Plan:   1. Vitamin D deficiency Low Vitamin D level contributes to fatigue and are associated with obesity, breast, and colon cancer. He agrees to continue to take prescription Vitamin D @50 ,000 IU every week and will follow-up for routine testing of Vitamin D, at least 2-3 times per year to avoid over-replacement.  - Vitamin D, Ergocalciferol, (DRISDOL) 1.25 MG (50000 UNIT) CAPS capsule; Take 1 capsule (50,000 Units total) by mouth every 7 (seven) days.  Dispense: 4 capsule; Refill: 0  2. Type 2 diabetes mellitus with other specified complication, without long-term current use of insulin (HCC) Good blood sugar control is important to decrease the likelihood of diabetic complications such as nephropathy, neuropathy, limb loss, blindness, coronary artery disease, and death. Intensive lifestyle modification including diet, exercise and weight loss are the first line of treatment for diabetes. Refill test strips. Follow-up with PCP, re: uncontrolled T2D  3. Stage 3b chronic kidney disease (HCC) Lab results and trends reviewed. We discussed several lifestyle modifications today and he will continue to work on diet, exercise and weight loss efforts. Avoid nephrotoxic medications. Orders and follow up as documented in patient record. Avoid NSAIDS and keep BP well controlled. Follow-up with PCP, re: Nephrology referral  Counseling . Chronic kidney disease (CKD) happens when the kidneys are damaged over a long period of time. . Most of  the time, this condition does not go away, but it can usually be controlled. Steps must be taken to slow down the kidney damage or to stop it from getting worse. . Intensive lifestyle modifications are the first line treatment for this issue.  Marland Kitchen Avoid buying foods that are: processed, frozen, or prepackaged to avoid excess salt.   4. Class 3 severe obesity with serious  comorbidity and body mass index (BMI) of 45.0 to 49.9 in adult, unspecified obesity type Gavin Lawson) Gavin Lawson is currently in the action stage of change. As such, his goal is to continue with weight loss efforts. He has agreed to the Category 1 Plan.   Focus on protein and vegetables when ordering meals out.  Order extra food to be taken home. Ask friends to assist with meal planning/prepping- focus on meals that will fall within standards of Cat 1 meal plan. Clear out voicemail and call PCP to schedule OV to discuss uncontrolled T2D and referral to Nephrology.  Exercise goals: No exercise has been prescribed at this time.  Behavioral modification strategies: increasing lean protein intake, meal planning and cooking strategies and planning for success.  Gavin Lawson has agreed to follow-up with our clinic in 2 weeks. He was informed of the importance of frequent follow-up visits to maximize his success with intensive lifestyle modifications for his multiple health conditions.   Objective:   Blood pressure 138/63, pulse 65, temperature (!) 97.5 F (36.4 C), height 5\' 8"  (1.727 m), weight (!) 304 lb (137.9 kg), SpO2 96 %. Body mass index is 46.22 kg/m.  General: Cooperative, alert, well developed, in no acute distress. HEENT: Conjunctivae and lids unremarkable. Cardiovascular: Regular rhythm.  Lungs: Normal work of breathing. Neurologic: No focal deficits.   Lab Results  Component Value Date   CREATININE 1.82 (H) 01/15/2021   BUN 34 (H) 01/15/2021   NA 136 01/15/2021   K 5.2 01/15/2021   CL 96 01/15/2021   CO2 27 01/15/2021   Lab Results  Component Value Date   ALT 10 01/15/2021   AST 14 01/15/2021   ALKPHOS 81 01/15/2021   BILITOT 0.5 01/15/2021   Lab Results  Component Value Date   HGBA1C 9.6 (H) 01/15/2021   HGBA1C 8.7 (H) 08/17/2020   HGBA1C 9.5 (H) 09/28/2015   Lab Results  Component Value Date   INSULIN 20.9 01/15/2021   INSULIN 18.0 08/17/2020   Lab Results  Component  Value Date   TSH 4.230 08/17/2020   Lab Results  Component Value Date   CHOL 167 08/17/2020   HDL 40 08/17/2020   LDLCALC 89 08/17/2020   TRIG 227 (H) 08/17/2020   CHOLHDL 4.1 09/28/2015   Lab Results  Component Value Date   WBC 7.0 08/17/2020   HGB 11.9 (L) 08/17/2020   HCT 36.8 (L) 08/17/2020   MCV 93 08/17/2020   PLT 242 08/17/2020    Attestation Statements:   Reviewed by clinician on day of visit: allergies, medications, problem list, medical history, surgical history, family history, social history, and previous encounter notes.  Time spent on visit including pre-visit chart review and post-visit care and charting was 34 minutes.   10/17/2020, am acting as Edmund Hilda for Energy manager, NP.  I have reviewed the above documentation for accuracy and completeness, and I agree with the above. -  Katy d. Danford, NP-C

## 2021-03-01 ENCOUNTER — Ambulatory Visit (INDEPENDENT_AMBULATORY_CARE_PROVIDER_SITE_OTHER): Payer: Medicare Other | Admitting: Adult Health

## 2021-03-05 ENCOUNTER — Other Ambulatory Visit: Payer: Self-pay

## 2021-03-05 ENCOUNTER — Ambulatory Visit (INDEPENDENT_AMBULATORY_CARE_PROVIDER_SITE_OTHER): Payer: Medicare Other | Admitting: Adult Health

## 2021-03-05 ENCOUNTER — Encounter (INDEPENDENT_AMBULATORY_CARE_PROVIDER_SITE_OTHER): Payer: Self-pay | Admitting: Adult Health

## 2021-03-05 VITALS — BP 122/67 | HR 71 | Temp 98.0°F | Ht 68.0 in | Wt 304.0 lb

## 2021-03-05 DIAGNOSIS — I152 Hypertension secondary to endocrine disorders: Secondary | ICD-10-CM

## 2021-03-05 DIAGNOSIS — E1169 Type 2 diabetes mellitus with other specified complication: Secondary | ICD-10-CM

## 2021-03-05 DIAGNOSIS — E1159 Type 2 diabetes mellitus with other circulatory complications: Secondary | ICD-10-CM

## 2021-03-05 DIAGNOSIS — Z6841 Body Mass Index (BMI) 40.0 and over, adult: Secondary | ICD-10-CM

## 2021-03-05 MED ORDER — GLUCOSE BLOOD VI STRP: ORAL_STRIP | 12 refills | Status: AC

## 2021-03-06 NOTE — Progress Notes (Signed)
Chief Complaint:   OBESITY Gavin Lawson is here to discuss his progress with his obesity treatment plan along with follow-up of his obesity related diagnoses. Gavin Lawson is on the Category 1 Plan and states he is following his eating plan approximately 60% of the time. Gavin Lawson states he is doing 0 minutes 0 times per week.  Today's visit was #: 12 Starting weight: 305 lbs Starting date: 08/17/2020 Today's weight: 304 lbs Today's date: 03/05/2021 Total lbs lost to date: 1 Total lbs lost since last in-office visit: 0  Interim History: Gavin Lawson maintained his weight over the last several weeks. He states "I have been paying attention to what I'm eating more lately." He has increased fruit, vegetables, and decreased "heavy" protein- opting for leaner protein choices.  He has PCP OV 03/09/2021 and will discuss uncontrolled type 2 diabetes.   Subjective:   1. Type 2 diabetes mellitus with other specified complication, without long-term current use of insulin (HCC) Gavin Lawson has not been checking ambulatory blood glucose because he is out of strips. He is on Novolin N 12 units BID. He was previously on Metformin, however intolerant to Rx (excessive weight gain). GLP-1 cost prohibitive even with insurance coverage- he is unable to afford Rx Co-Pay.  Lab Results  Component Value Date   HGBA1C 9.6 (H) 01/15/2021   HGBA1C 8.7 (H) 08/17/2020   HGBA1C 9.5 (H) 09/28/2015   Lab Results  Component Value Date   LDLCALC 89 08/17/2020   CREATININE 1.82 (H) 01/15/2021   Lab Results  Component Value Date   INSULIN 20.9 01/15/2021   INSULIN 18.0 08/17/2020    2. Hypertension associated with diabetes (HCC) Gavin Lawson's BP and heart rate are excellent at OV. He denies cardiac symptoms. He is on Coreg 25 mg BID and Demadex 20 mg- 1.5 tabs (30 mg) BID.  BP Readings from Last 3 Encounters:  03/05/21 122/67  02/14/21 138/63  01/29/21 (!) 156/66    Assessment/Plan:   1. Type 2 diabetes mellitus with other  specified complication, without long-term current use of insulin (HCC) Good blood sugar control is important to decrease the likelihood of diabetic complications such as nephropathy, neuropathy, limb loss, blindness, coronary artery disease, and death. Intensive lifestyle modification including diet, exercise and weight loss are the first line of treatment for diabetes. Follow up with PCP for uncontrolled type 2 diabetes. He has an appointment 03/09/2021. Request financial assistance application from insurance carrier to apply for co-pay coverage for GLP-1 Rx.  - glucose blood test strip; Use Bayer Contour test strips as instructed TID  Dispense: 100 each; Refill: 12  2. Hypertension associated with diabetes (HCC) Gavin Lawson is working on healthy weight loss and exercise to improve blood pressure control. We will watch for signs of hypotension as he continues his lifestyle modifications. Continue current anti-hypertensive regimen.  3. Class 3 severe obesity with serious comorbidity and body mass index (BMI) of 45.0 to 49.9 in adult, unspecified obesity type Gavin Lawson) Gavin Lawson is currently in the action stage of change. As such, his goal is to continue with weight loss efforts. He has agreed to the Category 1 Plan.   Exercise goals: Daily walking- around the home and running errands  Behavioral modification strategies: increasing lean protein intake, decreasing simple carbohydrates, no skipping meals, meal planning and cooking strategies and planning for success.  Gavin Lawson has agreed to follow-up with our clinic in 2 weeks. He was informed of the importance of frequent follow-up visits to maximize his success with intensive lifestyle modifications  for his multiple health conditions.   Objective:   Blood pressure 122/67, pulse 71, temperature 98 F (36.7 C), height 5\' 8"  (1.727 m), weight (!) 304 lb (137.9 kg), SpO2 96 %. Body mass index is 46.22 kg/m.  General: Cooperative, alert, well developed, in no  acute distress. HEENT: Conjunctivae and lids unremarkable. Cardiovascular: Regular rhythm.  Lungs: Normal work of breathing. Neurologic: No focal deficits.   Lab Results  Component Value Date   CREATININE 1.82 (H) 01/15/2021   BUN 34 (H) 01/15/2021   NA 136 01/15/2021   K 5.2 01/15/2021   CL 96 01/15/2021   CO2 27 01/15/2021   Lab Results  Component Value Date   ALT 10 01/15/2021   AST 14 01/15/2021   ALKPHOS 81 01/15/2021   BILITOT 0.5 01/15/2021   Lab Results  Component Value Date   HGBA1C 9.6 (H) 01/15/2021   HGBA1C 8.7 (H) 08/17/2020   HGBA1C 9.5 (H) 09/28/2015   Lab Results  Component Value Date   INSULIN 20.9 01/15/2021   INSULIN 18.0 08/17/2020   Lab Results  Component Value Date   TSH 4.230 08/17/2020   Lab Results  Component Value Date   CHOL 167 08/17/2020   HDL 40 08/17/2020   LDLCALC 89 08/17/2020   TRIG 227 (H) 08/17/2020   CHOLHDL 4.1 09/28/2015   Lab Results  Component Value Date   WBC 7.0 08/17/2020   HGB 11.9 (L) 08/17/2020   HCT 36.8 (L) 08/17/2020   MCV 93 08/17/2020   PLT 242 08/17/2020   No results found for: IRON, TIBC, FERRITIN  Obesity Behavioral Intervention:   Approximately 15 minutes were spent on the discussion below.  ASK: We discussed the diagnosis of obesity with 10/17/2020 today and Wyndham agreed to give Gavin Lawson permission to discuss obesity behavioral modification therapy today.  ASSESS: Riley has the diagnosis of obesity and his BMI today is 46.3. Keyan is in the action stage of change.   ADVISE: Jhayden was educated on the multiple health risks of obesity as well as the benefit of weight loss to improve his health. He was advised of the need for long term treatment and the importance of lifestyle modifications to improve his current health and to decrease his risk of future health problems.  AGREE: Multiple dietary modification options and treatment options were discussed and Deaundre agreed to follow the recommendations  documented in the above note.  ARRANGE: Brenen was educated on the importance of frequent visits to treat obesity as outlined per CMS and USPSTF guidelines and agreed to schedule his next follow up appointment today.  Attestation Statements:   Reviewed by clinician on day of visit: allergies, medications, problem list, medical history, surgical history, family history, social history, and previous encounter notes.  Gavin Lawson, am acting as Gavin Lawson for Energy manager, NP.  I have reviewed the above documentation for accuracy and completeness, and I agree with the above. -  Gavin Lawson d. Gavin Radin, NP-C

## 2021-03-21 ENCOUNTER — Encounter (INDEPENDENT_AMBULATORY_CARE_PROVIDER_SITE_OTHER): Payer: Self-pay

## 2021-03-21 ENCOUNTER — Ambulatory Visit (INDEPENDENT_AMBULATORY_CARE_PROVIDER_SITE_OTHER): Payer: Medicare Other | Admitting: Adult Health

## 2022-07-24 ENCOUNTER — Encounter (INDEPENDENT_AMBULATORY_CARE_PROVIDER_SITE_OTHER): Payer: Self-pay

## 2024-07-16 DEATH — deceased
